# Patient Record
Sex: Female | Born: 1960 | Race: White | Hispanic: No | Marital: Married | Smoking: Never smoker
Health system: Southern US, Community
[De-identification: ages and names within clinical notes are randomized; demographics above are authoritative.]

## PROBLEM LIST (undated history)

## (undated) DIAGNOSIS — K5909 Other constipation: Secondary | ICD-10-CM

## (undated) DIAGNOSIS — G709 Myoneural disorder, unspecified: Secondary | ICD-10-CM

## (undated) DIAGNOSIS — M722 Plantar fascial fibromatosis: Secondary | ICD-10-CM

## (undated) DIAGNOSIS — G43909 Migraine, unspecified, not intractable, without status migrainosus: Secondary | ICD-10-CM

## (undated) HISTORY — DX: Migraine, unspecified, not intractable, without status migrainosus: G43.909

## (undated) HISTORY — DX: Myoneural disorder, unspecified: G70.9

## (undated) HISTORY — DX: Plantar fascial fibromatosis: M72.2

## (undated) HISTORY — DX: Other constipation: K59.09

---

## 1989-01-20 HISTORY — PX: BUNIONECTOMY: SHX129

## 1997-09-26 ENCOUNTER — Other Ambulatory Visit: Admission: RE | Admit: 1997-09-26 | Discharge: 1997-09-26 | Payer: Self-pay | Admitting: Obstetrics and Gynecology

## 1998-12-27 ENCOUNTER — Ambulatory Visit: Admission: RE | Admit: 1998-12-27 | Discharge: 1998-12-27 | Payer: Self-pay | Admitting: Internal Medicine

## 1999-11-04 ENCOUNTER — Ambulatory Visit (HOSPITAL_COMMUNITY): Admission: RE | Admit: 1999-11-04 | Discharge: 1999-11-04 | Payer: Self-pay | Admitting: *Deleted

## 2003-02-17 ENCOUNTER — Other Ambulatory Visit: Admission: RE | Admit: 2003-02-17 | Discharge: 2003-02-17 | Payer: Self-pay | Admitting: Family Medicine

## 2004-06-04 ENCOUNTER — Other Ambulatory Visit: Admission: RE | Admit: 2004-06-04 | Discharge: 2004-06-04 | Payer: Self-pay | Admitting: Family Medicine

## 2005-01-20 HISTORY — PX: SPINAL FUSION: SHX223

## 2005-04-08 ENCOUNTER — Inpatient Hospital Stay (HOSPITAL_COMMUNITY): Admission: RE | Admit: 2005-04-08 | Discharge: 2005-04-11 | Payer: Self-pay | Admitting: Neurosurgery

## 2005-12-11 ENCOUNTER — Emergency Department: Payer: Self-pay | Admitting: Emergency Medicine

## 2006-03-31 ENCOUNTER — Other Ambulatory Visit: Admission: RE | Admit: 2006-03-31 | Discharge: 2006-03-31 | Payer: Self-pay | Admitting: Family Medicine

## 2006-11-05 ENCOUNTER — Emergency Department: Payer: Self-pay | Admitting: Unknown Physician Specialty

## 2007-09-23 ENCOUNTER — Encounter: Admission: RE | Admit: 2007-09-23 | Discharge: 2007-09-23 | Payer: Self-pay | Admitting: Neurosurgery

## 2008-01-21 HISTORY — PX: SPINE SURGERY: SHX786

## 2008-04-28 ENCOUNTER — Other Ambulatory Visit: Admission: RE | Admit: 2008-04-28 | Discharge: 2008-04-28 | Payer: Self-pay | Admitting: Family Medicine

## 2008-04-30 ENCOUNTER — Emergency Department: Payer: Self-pay | Admitting: Emergency Medicine

## 2008-11-13 ENCOUNTER — Ambulatory Visit (HOSPITAL_BASED_OUTPATIENT_CLINIC_OR_DEPARTMENT_OTHER)
Admission: RE | Admit: 2008-11-13 | Discharge: 2008-11-13 | Payer: Self-pay | Admitting: Physical Medicine and Rehabilitation

## 2008-11-30 ENCOUNTER — Ambulatory Visit (HOSPITAL_COMMUNITY): Admission: RE | Admit: 2008-11-30 | Discharge: 2008-12-01 | Payer: Self-pay | Admitting: Orthopedic Surgery

## 2009-09-04 ENCOUNTER — Other Ambulatory Visit: Admission: RE | Admit: 2009-09-04 | Discharge: 2009-09-04 | Payer: Self-pay | Admitting: Family Medicine

## 2010-04-24 LAB — CBC
HCT: 40.8 % (ref 36.0–46.0)
Hemoglobin: 14.1 g/dL (ref 12.0–15.0)

## 2010-06-07 NOTE — Discharge Summary (Signed)
[name], [name]                ACCOUNT NO.:  [id number]   MEDICAL RECORD NO.:  [id number]          PATIENT TYPE:  INP   LOCATION:  3037                         FACILITY:  [facility]   PHYSICIAN:  Danae Orleans. Venetia Maxon, M.D.  DATE OF BIRTH:  1960/08/15   DATE OF ADMISSION:  04/08/2005  DATE OF DISCHARGE:  04/11/2005                                 DISCHARGE SUMMARY   REASONS FOR ADMISSION:  1.  Lumbosacral spondylolisthesis.  2.  Lumbar disk degeneration.  3.  Hypertension.  4.  Unspecified constipation.  5.  Lumbar radiculopathy, lumbar spondylosis.   FINAL DIAGNOSES:  1.  Lumbosacral spondylolisthesis.  2.  Lumbar disk degeneration.  3.  Hypertension.  4.  Unspecified constipation.  5.  Lumbar radiculopathy, lumbar spondylosis.   HISTORY OF ILLNESS/HOSPITAL COURSE:  Jessica [name] is a 50 year old woman  with grade II spondylolisthesis of L5-S1 with retrolisthesis of L4-5 with  spondylosis of L5.  She has excruciating leg and low back pain.  She was  admitted to the hospital on same-day procedure basis and underwent  uncomplicated L5 Gill procedure with L5-S1 posterior lumbar interbody fusion  with pedicle screw fixation, L4-S1 bilaterally, along with posterolateral  arthrodesis, L4-S1 levels.  She did well with surgery and was gradually  mobilized, had some vomiting initially after surgery, was felt to be  secondary to intolerance to morphine.  She was doing well on the 23rd and  was discharged home at that point with instructions to follow up in three  weeks with discharge medications of Percocet and Valium.   DISCHARGE STATUS:  Improved.      Danae Orleans. Venetia Maxon, M.D.  Electronically Signed     JDS/MEDQ  D:  06/04/2005  T:  06/04/2005  Job:  [id number]

## 2010-06-07 NOTE — Op Note (Signed)
Jessica [name], [name]                ACCOUNT NO.:  [id number]   MEDICAL RECORD NO.:  [id number]          PATIENT TYPE:  INP   LOCATION:  2899                         FACILITY:  [facility]   PHYSICIAN:  Danae Orleans. Venetia Maxon, M.D.  DATE OF BIRTH:  05-Jul-1960   DATE OF PROCEDURE:  04/08/2005  DATE OF DISCHARGE:                                 OPERATIVE REPORT   PREOPERATIVE DIAGNOSIS:  Grade 2 spondylolisthesis of L5 on S1 with  retrolisthesis of L4 on L5 with spondylolysis of L5 with degenerative disk  disease and radiculopathy.   POSTOPERATIVE DIAGNOSIS:  Grade 2 spondylolisthesis of L5 on S1 with  retrolisthesis of L4 on L5 with spondylolysis of L5 with degenerative disk  disease and radiculopathy.   OPERATION PERFORMED:  1.  L5 Gill procedure.  2.  L4-5 laminectomy.  3.  L5 through S1 posterior lumbar interbody fusion with 8 mm PEEK interbody      cages with morcellized bone autograft.  4.  Pedicle screw fixation L4 through S1 bilaterally.  5.  Posterolateral arthrodesis L4 through S1 levels.   SURGEON:  Danae Orleans. Venetia Maxon, M.D.   ASSISTANT:  Cristi Loron, M.D.   ANESTHESIA:  General endotracheal.   ESTIMATED BLOOD LOSS:  850 mL.   FLUIDS REPLACED:  300 mL of Cell Saver blood return to the patient.   COMPLICATIONS:  None.   DISPOSITION:  Recovery.   INDICATIONS FOR PROCEDURE:  Jessica [name] is a 50 year old woman with severe  left greater than right lower extremity pain with grade 2 spondylolisthesis  of L5 on S1 with retrolisthesis of L4 on L5.  She has severe foraminal nerve  root compression, left greater than right of the L5 nerve roots.  It was  elected to take her to surgery for decompression and fusion at these  affected levels.   DESCRIPTION OF PROCEDURE:  Jessica [name] was brought to the operating room.  Following the satisfactory and uncomplicated induction of general  endotracheal anesthesia and placement of intravenous lines and Foley  catheter the patient was  turned in a prone position on the operating table.  The low back was prepped and draped in the usual sterile fashion.  The area  of planned incision was infiltrated with 0.25% Marcaine, 0.5% lidocaine,  1:200,000 epinephrine.  Incision was made in the midline and carried through  adipose tissue.  The lumbodorsal fascia was incised bilaterally.  Subperiosteal dissection was then performed exposing the L4 and L5  transverse processes and sacral ala. The posterior elements of L5 were quite  loose and wedged under the L5 spinous process.  After localizing x-ray was  obtained with marker probes at the L4, L5 transverse processes and the  sacral ala, a Gill procedure of L5 was performed with resultant  decompression of the thecal sac.  Both L5 nerve roots were decompressed as  they extended out the neural foramina and there was a significant  spondylolysis, grumous and also superior articular facet of L5 which was  causing significant compression of the nerve root.  Laminectomy was done of  L4 bilaterally to decompress the  thecal sac and lateral recesses.  Subsequently the interspace at L5-S1 was decompressed.  There was  significant overgrowth of bone and considerable spondylolisthesis.  Initially the interspace was incised and then Kerrison rongeur was used to  remove the superior lip of S1 and subsequently the disk space was entered.  Disk material was removed.  An 8 mm interbody spacer was then placed  initially on the right and subsequently on the left and the disk was further  removed with a variety of curettes and ring curettes.  After a thorough  diskectomy had been performed, this was done under loupe magnification as  was the nerve root decompression.  An 8 mm PEEK interbody cage was packed  with morcellized bone autograft from the Gill procedure. This was then  inserted into the interspace, countersunk appropriately and this was done  bilaterally with significant restoration of the  foraminal height leading to  significant decompression of the L5 nerve root.  Subsequently pedicle screws  were placed, at S1 a 6.5 x 45 mm sacral screw was placed.  At L5 6.5 x 55 mm  screws were placed.  At L4 6.5 x 55 mm screws were also placed.  All screws  had excellent purchase and their position was confirmed in AP and lateral  fluoroscopy.  60 mm prelordosed rods were then affixed to the screw heads  after decortication of the transverse processes of L4, L5 and the sacral ala  were performed bilaterally and morcellized bone autograft along with bone  graft allograft extender which had been reconstituted with the marrow rich  blood aspirated from the tract of pedicle screws was then packed in the  posterolateral region.  The locking caps were then engaged and a final  tightener was used.  Prior to placing the bone graft the wound was irrigated  with bacitracin saline.  Subsequently, the self-retaining retractor was  removed.  The lumbodorsal fascia was closed with 0 Vicryl suture.  The  subcutaneous tissue were reapproximated with 2-0 Vicryl interrupted inverted  sutures and the skin edges were reapproximated with interrupted 3-0 Vicryl  subcuticular stitch.  The wound was dressed with benzoin and Steri-Strips,  Telfa gauze and tape.  The patient was extubated in the operating room and  taken to the recovery room in stable and satisfactory condition having  tolerated the operation well.  Counts were correct at the end of the case.      Danae Orleans. Venetia Maxon, M.D.  Electronically Signed     JDS/MEDQ  D:  04/08/2005  T:  04/09/2005  Job:  [id number]

## 2010-06-10 IMAGING — RF DG THORACIC SPINE 2V
1 series · 2 of 2 positions shown · non-contrast
Comparison: 11/13/2008.

CLINICAL DATA: 44-year-old male undergoing spinal stimulator
placement.  Pain.

THORACIC SPINE - 2 VIEW

[Series 1: run · 2 of 2 slices shown]
[im 1/2]
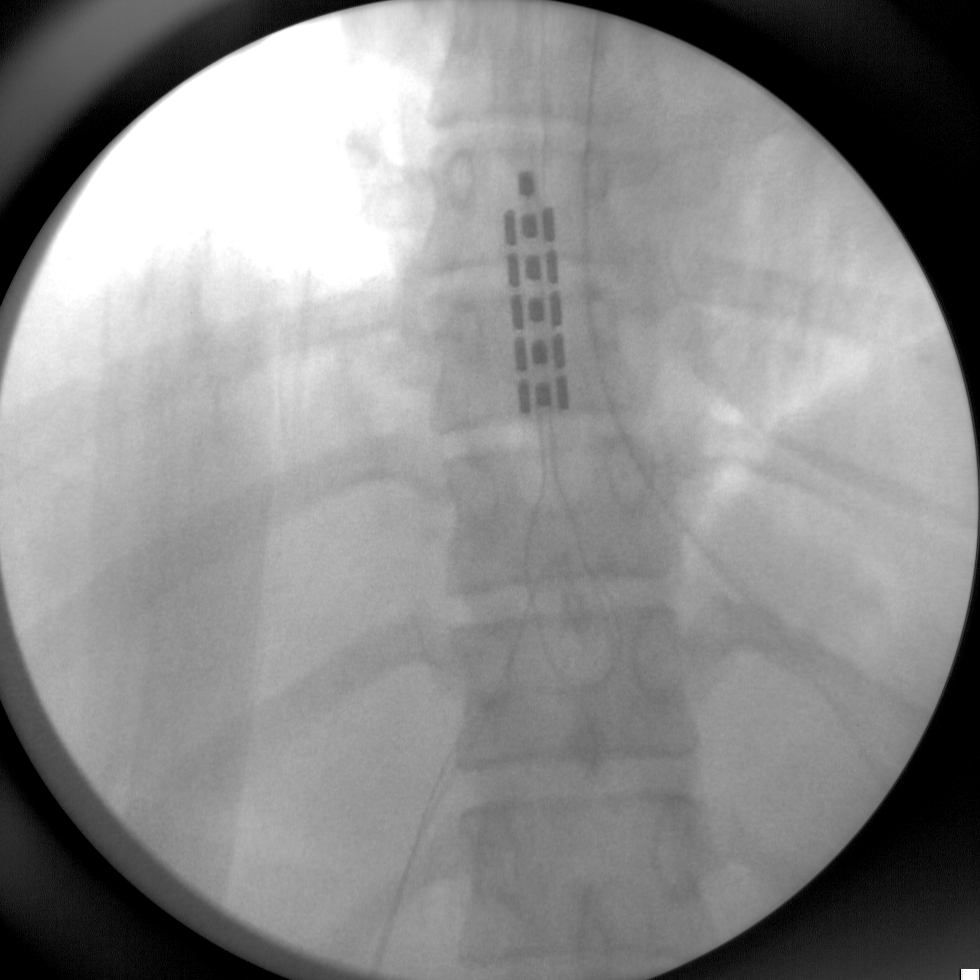
[im 2/2]
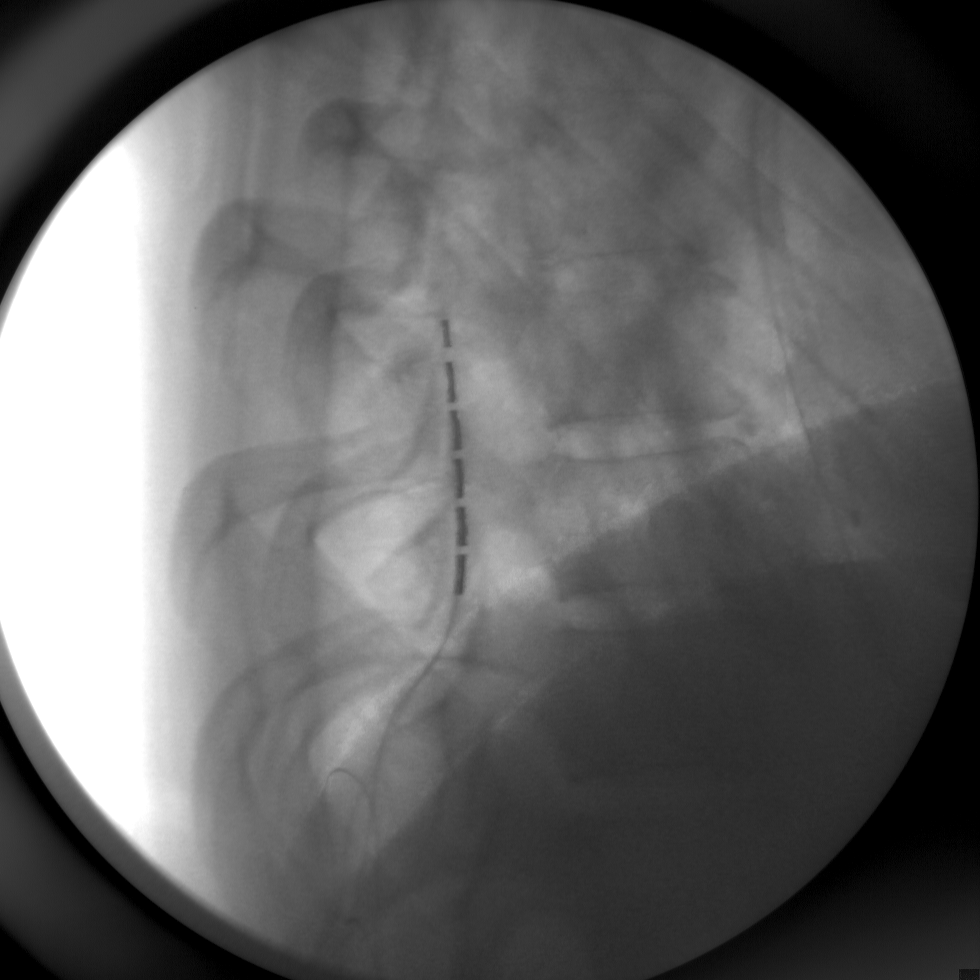

[2 of 2 positions shown; findings below may reference images not displayed]

FINDINGS: Two intraoperative fluoroscopic spot views of the
thoracic spine demonstrate spinal stimulator placement.  Assuming
the lowest visualized ribs are T12, the placement would be at T9
and T8.
IMPRESSION: Thoracic spinal stimulator device placement.

## 2011-05-06 ENCOUNTER — Ambulatory Visit: Payer: 59 | Admitting: Physical Medicine & Rehabilitation

## 2011-05-20 ENCOUNTER — Emergency Department: Payer: Self-pay | Admitting: *Deleted

## 2011-05-29 ENCOUNTER — Encounter: Payer: Self-pay | Admitting: Physical Medicine & Rehabilitation

## 2011-05-29 ENCOUNTER — Ambulatory Visit (HOSPITAL_BASED_OUTPATIENT_CLINIC_OR_DEPARTMENT_OTHER): Payer: 59 | Admitting: Physical Medicine & Rehabilitation

## 2011-05-29 ENCOUNTER — Encounter: Payer: 59 | Attending: Physical Medicine & Rehabilitation

## 2011-05-29 VITALS — BP 142/76 | HR 108 | Resp 18 | Ht 67.0 in | Wt 257.0 lb

## 2011-05-29 DIAGNOSIS — M76899 Other specified enthesopathies of unspecified lower limb, excluding foot: Secondary | ICD-10-CM

## 2011-05-29 DIAGNOSIS — M7060 Trochanteric bursitis, unspecified hip: Secondary | ICD-10-CM | POA: Insufficient documentation

## 2011-05-29 DIAGNOSIS — M797 Fibromyalgia: Secondary | ICD-10-CM

## 2011-05-29 DIAGNOSIS — M545 Low back pain, unspecified: Secondary | ICD-10-CM

## 2011-05-29 DIAGNOSIS — M25569 Pain in unspecified knee: Secondary | ICD-10-CM

## 2011-05-29 DIAGNOSIS — M961 Postlaminectomy syndrome, not elsewhere classified: Secondary | ICD-10-CM

## 2011-05-29 DIAGNOSIS — G894 Chronic pain syndrome: Secondary | ICD-10-CM

## 2011-05-29 DIAGNOSIS — M722 Plantar fascial fibromatosis: Secondary | ICD-10-CM | POA: Insufficient documentation

## 2011-05-29 DIAGNOSIS — M25562 Pain in left knee: Secondary | ICD-10-CM

## 2011-05-29 DIAGNOSIS — G8929 Other chronic pain: Secondary | ICD-10-CM | POA: Insufficient documentation

## 2011-05-29 DIAGNOSIS — IMO0001 Reserved for inherently not codable concepts without codable children: Secondary | ICD-10-CM

## 2011-05-29 NOTE — Patient Instructions (Signed)

## 2011-05-29 NOTE — Progress Notes (Signed)
Subjective:    Patient ID: Jessica [name], female    DOB: 30-Mar-1960, 51 y.o.   MRN: [id number]  HPI  Pain is widespread and berries depending on time of day. In the mornings the pain on the bottom of the feet. At night hip pain and knee pain. History of spondylolisthesis which was diagnosed 2007. Past surgical Surgery done by Dr. Venetia Maxon in 2007. Pre-operative MRI of the lumbar spine demonstrated L5-S1 grade 1 spondylolisthesis with moderate left foraminal narrowing. L4-L5-S1 fused, spinal cord stimulation permanent implant 2010 Dr. Shon Baton. The spinal cord stimulator has been helpful for left leg pain.  Medication management has been with Dr. Ethelene Hal now tapering Percocet 10 mg is down to 3 times per day with plans to go down to 2 times a day this week.   will walk between 20 and 45 minutes 4 or 5 times per day and walks on flat surfaces. Also has a history of migraine headaches and uses topiramate 200 mg at night, Zomig 5 mg intranasal as needed and Lyrica 150 mg at night Past medical history is significant for chronic constipation. Also for obesity with a BMI between 30 and 40., Depression Pain Inventory Average Pain 7 Pain Right Now 5 My pain is constant, sharp, burning, tingling and aching  In the last 24 hours, has pain interfered with the following? General activity 7 Relation with others 8 Enjoyment of life 7 What TIME of day is your pain at its worst? morning evening and night Sleep (in general) Fair  Pain is worse with: walking, bending, sitting and standing Pain improves with: rest, heat/ice, pacing activities, medication and stretching Relief from Meds: 6  Mobility walk without assistance how many minutes can you walk? 60 ability to climb steps?  yes do you drive?  yes Do you have any goals in this area?  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  Prior Studies Any changes since last visit?  no  Physicians involved in your care Any changes since last visit?  yes Primary care [place] [facility] Richard Ethelene Hal     Review of Systems  Constitutional: Positive for appetite change and unexpected weight change.  Gastrointestinal: Positive for nausea, vomiting, abdominal pain, diarrhea and constipation.  Genitourinary: Positive for difficulty urinating.  Hematological: Bruises/bleeds easily.  All other systems reviewed and are negative.       Objective:   Physical Exam  Constitutional: She is oriented to person, place, and time. She appears well-developed.  Musculoskeletal:       Cervical back: She exhibits tenderness.       Back:       Right foot: She exhibits tenderness.       Left foot: She exhibits tenderness.       Tender on the plantar aspect of the heel as well as tender over the metatarsal heads. Pain with toe extension   11/18 fibromyalgia tender points positive. Right greater trochanteric bursa very tender Lumbar range of motion is reduced Straight leg raising test is negative Right knee and hip range of motion is normal     Neurological: She is alert and oriented to person, place, and time. She has normal strength. A sensory deficit is present.  Reflex Scores:  Tricep reflexes are 2+ on the right side and 2+ on the left side.      Bicep reflexes are 2+ on the right side and 2+ on the left side.      Brachioradialis reflexes are 2+ on the right side and 2+ on the left side.      Patellar reflexes are 2+ on the right side and 2+ on the left side.      Achilles reflexes are 2+ on the right side and 2+ on the left side.      Decreased left L3, L5, S1   Pain over the plantar fascia bilaterally.        Assessment & Plan:  1. Lumbar postlaminectomy syndrome with chronic pain. She has neurogenic pain which has responded partially to spinal cord stimulation. She has chronic axial back pain which is only partially relieved with narcotic analgesics however she is having some side effects including severe dryness of mouth and she would like to wean off of these medicines. She will be down to 2 10 mg tablets of oxycodone next week I have instructed her to instead break them in half and take one half tablet 4 times per day.   2. The right hip trochanteric bursitis, injection today, instruct range of motion   right hip trochanteric bursa injection indication is right hip pain severe which is interfering with sleep and only partially response to medication management. Patient placed in a left lateral decubitus position after informed consent was obtained. Area was marked and prepped with Betadine and entered with a 25-gauge 3 inch spinal needle inserted to bone contact. Then a solution containing 1 cc of 40 mg per cc Depo-Medrol and 4 cc of 1% lidocaine were injected. Patient tolerated procedure well 3. Fibromyalgia syndrome this is a probable diagnosis. Reportedly had normal TSH last month. Will check arthritis panel. Patient information given 4. Plantar fasciitis instruct stretching exercises 5. Left knee pain no history of recent trauma. Will check x-rays and possibly inject next visit in 2-3 weeks.

## 2011-05-30 LAB — RHEUMATOID FACTOR: Rhuematoid fact SerPl-aCnc: <10 IU/mL (ref <=14)

## 2011-06-06 ENCOUNTER — Telehealth: Payer: Self-pay | Admitting: *Deleted

## 2011-06-06 NOTE — Telephone Encounter (Signed)
Pt aware of normal labs

## 2011-06-06 NOTE — Telephone Encounter (Signed)
Message copied by Caryl Ada on Fri Jun 06, 2011  8:39 AM ------      Message from: Ricarda Frame E      Created: Thu Jun 05, 2011  4:03 PM       Blood work normal;no evidence of inflammatory arthritis

## 2011-06-09 ENCOUNTER — Emergency Department: Payer: Self-pay | Admitting: Emergency Medicine

## 2011-06-10 ENCOUNTER — Ambulatory Visit
Admission: RE | Admit: 2011-06-10 | Discharge: 2011-06-10 | Disposition: A | Discharge status: Home / Self Care | Payer: 59 | Source: Ambulatory Visit | Attending: Physical Medicine & Rehabilitation | Admitting: Physical Medicine & Rehabilitation

## 2011-06-10 DIAGNOSIS — M25562 Pain in left knee: Secondary | ICD-10-CM

## 2011-06-11 ENCOUNTER — Telehealth: Payer: Self-pay | Admitting: *Deleted

## 2011-06-11 NOTE — Telephone Encounter (Signed)
Notified Jessica [name] on personal identified voicemail that her knee x-ray was normal.

## 2011-06-17 ENCOUNTER — Encounter: Payer: Self-pay | Admitting: Physical Medicine & Rehabilitation

## 2011-06-17 ENCOUNTER — Ambulatory Visit (HOSPITAL_BASED_OUTPATIENT_CLINIC_OR_DEPARTMENT_OTHER): Payer: 59 | Admitting: Physical Medicine & Rehabilitation

## 2011-06-17 VITALS — BP 125/70 | HR 78 | Resp 16 | Ht 67.0 in | Wt 252.0 lb

## 2011-06-17 DIAGNOSIS — M25569 Pain in unspecified knee: Secondary | ICD-10-CM

## 2011-06-17 DIAGNOSIS — IMO0001 Reserved for inherently not codable concepts without codable children: Secondary | ICD-10-CM

## 2011-06-17 DIAGNOSIS — M1710 Unilateral primary osteoarthritis, unspecified knee: Secondary | ICD-10-CM

## 2011-06-17 DIAGNOSIS — M797 Fibromyalgia: Secondary | ICD-10-CM

## 2011-06-17 MED ORDER — PREGABALIN 150 MG PO CAPS: 150.0000 mg | ORAL_CAPSULE | Freq: Two times a day (BID) | ORAL | Status: DC

## 2011-06-17 MED ORDER — CYCLOBENZAPRINE HCL 5 MG PO TABS: 5.0000 mg | ORAL_TABLET | Freq: Two times a day (BID) | ORAL | Status: DC | PRN

## 2011-06-17 NOTE — Progress Notes (Signed)
Left knee injection Indication osteoarthritis and left knee pain unresponsive to oral medication management Informed consent was obtained after describing risks and benefits of the procedure with the patient his include bleeding bruising and infection she elects to proceed and has given written consent patient placed in a supine position towel roll underneath the left knee superior lateral approach used. Area marked and prepped with Betadine and alcohol entered with a 25-gauge inch Needle after negative draw back for blood 1 cc of 40 mg per cc Depo-Medrol and 4 cc 1% lidocaine were injected. Patient tolerated procedure well post procedure instructions given

## 2011-06-17 NOTE — Progress Notes (Signed)
Subjective:    Patient ID: Jessica [name], female    DOB: December 09, 1960, 51 y.o.   MRN: [id number]  HPI  Right hip injection helps her sleep on her right side. Continues have left knee pain complains of frequent urination she will be seeing her primary physician today Patient is taking one half tablet oxycodone 4 times per day Pain Inventory Average Pain 8 Pain Right Now 5 My pain is constant, sharp, burning, tingling and aching  In the last 24 hours, has pain interfered with the following? General activity 7 Relation with others 6 Enjoyment of life 6 What TIME of day is your pain at its worst? Morning and Night Sleep (in general) Fair  Pain is worse with: walking, bending, sitting and standing Pain improves with: rest, heat/ice, medication and injections Relief from Meds: 7  Mobility walk without assistance how many minutes can you walk? 40-60 ability to climb steps?  yes do you drive?  yes  Function employed # of hrs/week 20-25 I need assistance with the following:  dressing and household duties  Neuro/Psych bladder control problems weakness numbness tingling confusion depression suicidal thoughts  Prior Studies Any changes since last visit?  no  Physicians involved in your care Any changes since last visit?  no   Family History  Problem Relation Age of Onset  . Depression Mother   . Celiac disease Mother   . Hypertension Father    History   Social History  . Marital Status: Married    Spouse Name: N/A    Number of Children: N/A  . Years of Education: N/A   Social History Main Topics  . Smoking status: Never Smoker   . Smokeless tobacco: Never Used  . Alcohol Use: None  . Drug Use: None  . Sexually Active: None   Other Topics Concern  . None   Social History Narrative  . None   Past Surgical History  Procedure Date  . Spine surgery 2010    nerve stimulator implant  . Spinal fusion 2007  . Bunionectomy 1991    bilateral   Past Medical  History  Diagnosis Date  . Neuromuscular disorder   . Chronic constipation   . Migraines    BP 125/70  Pulse 78  Resp 16  Ht 5\' 7"  (1.702 m)  Wt 252 lb (114.306 kg)  BMI 39.47 kg/m2  SpO2 98%  LMP 06/10/2011      Review of Systems  Constitutional: Positive for appetite change and unexpected weight change.  Gastrointestinal: Positive for nausea, vomiting and constipation.  Hematological: Bruises/bleeds easily.  Psychiatric/Behavioral: Positive for suicidal ideas (No Plan at this time).       Objective:   Physical Exam  Left knee without evidence of effusion. Ambulation without evidence of toe drag or knee instability Mood and affect is appropriate      Assessment & Plan:   1. Lumbar postlaminectomy syndrome with chronic pain. She has neurogenic pain which has responded partially to spinal cord stimulation. She has chronic axial back pain which is only partially relieved with narcotic analgesics however she is having some side effects including severe dryness of mouth and she would like to wean off of these medicines. She is down to 2 10 mg tablets of oxycodone this week I have instructed her to instead break them in half and take one half tablet 3 times per day. 2. The right hip trochanteric bursitis,improved 3. Fibromyalgia syndrome this is a probable diagnosis. Reportedly had normal TSH last month.  Will check arthritis panel. Patient information given increase Lyrica at 150 mg twice a day 4. Plantar fasciitis instruct stretching exercises  5. Left knee pain no history of recent trauma. Will  inject today next visit in 3 weeks.

## 2011-06-17 NOTE — Patient Instructions (Signed)
Reduce oxycodone to one half tablet 3 times per day until your next visit Increase Lyrica to 1 tablet twice a day You may benefit from a urology consultation to evaluate for interstitial cystitis Will see me back in 3-4 weeks Continue her exercises for your plantar fasciitis

## 2011-07-07 ENCOUNTER — Ambulatory Visit (HOSPITAL_BASED_OUTPATIENT_CLINIC_OR_DEPARTMENT_OTHER): Payer: 59 | Admitting: Physical Medicine & Rehabilitation

## 2011-07-07 ENCOUNTER — Encounter: Payer: 59 | Attending: Physical Medicine & Rehabilitation

## 2011-07-07 ENCOUNTER — Encounter: Payer: Self-pay | Admitting: Physical Medicine & Rehabilitation

## 2011-07-07 VITALS — BP 136/76 | HR 81 | Resp 14 | Ht 67.0 in | Wt 252.0 lb

## 2011-07-07 DIAGNOSIS — G8929 Other chronic pain: Secondary | ICD-10-CM | POA: Insufficient documentation

## 2011-07-07 DIAGNOSIS — M545 Low back pain, unspecified: Secondary | ICD-10-CM | POA: Insufficient documentation

## 2011-07-07 DIAGNOSIS — G5712 Meralgia paresthetica, left lower limb: Secondary | ICD-10-CM

## 2011-07-07 DIAGNOSIS — M797 Fibromyalgia: Secondary | ICD-10-CM

## 2011-07-07 DIAGNOSIS — G5710 Meralgia paresthetica, unspecified lower limb: Secondary | ICD-10-CM

## 2011-07-07 DIAGNOSIS — M25569 Pain in unspecified knee: Secondary | ICD-10-CM | POA: Insufficient documentation

## 2011-07-07 DIAGNOSIS — M961 Postlaminectomy syndrome, not elsewhere classified: Secondary | ICD-10-CM

## 2011-07-07 DIAGNOSIS — M722 Plantar fascial fibromatosis: Secondary | ICD-10-CM | POA: Insufficient documentation

## 2011-07-07 DIAGNOSIS — IMO0001 Reserved for inherently not codable concepts without codable children: Secondary | ICD-10-CM

## 2011-07-07 DIAGNOSIS — M76899 Other specified enthesopathies of unspecified lower limb, excluding foot: Secondary | ICD-10-CM | POA: Insufficient documentation

## 2011-07-07 NOTE — Progress Notes (Signed)
Subjective:    Patient ID: Jessica [name], female    DOB: 05-11-1960, 51 y.o.   MRN: [id number]  HPI Knee injection was helpful on the left side Numbness in the left lateral thigh. No falls or any trauma. Achiness around the neck area was improved with increased  Lyrica dosage Pain Inventory Average Pain 7 Pain Right Now 6 My pain is constant, sharp, burning, dull, tingling and aching  In the last 24 hours, has pain interfered with the following? General activity 8 Relation with others 7 Enjoyment of life 7 What TIME of day is your pain at its worst? morning and evening Sleep (in general) Fair  Pain is worse with: walking, sitting and standing Pain improves with: rest, heat/ice, medication, TENS and injections Relief from Meds: 7  Mobility how many minutes can you walk? 20-35 ability to climb steps?  yes do you drive?  yes Do you have any goals in this area?  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  yes  Neuro/Psych bladder control problems weakness numbness tingling spasms confusion depression  Prior Studies Any changes since last visit?  no  Physicians involved in your care Any changes since last visit?  no   Family History  Problem Relation Age of Onset  . Depression Mother   . Celiac disease Mother   . Hypertension Father    History   Social History  . Marital Status: Married    Spouse Name: N/A    Number of Children: N/A  . Years of Education: N/A   Social History Main Topics  . Smoking status: Never Smoker   . Smokeless tobacco: Never Used  . Alcohol Use: None  . Drug Use: None  . Sexually Active: None   Other Topics Concern  . None   Social History Narrative  . None   Past Surgical History  Procedure Date  . Spine surgery 2010    nerve stimulator implant  . Spinal fusion 2007  . Bunionectomy 1991    bilateral   Past Medical History  Diagnosis Date  . Neuromuscular disorder   .  Chronic constipation   . Migraines    BP 136/76  Pulse 81  Resp 14  Ht 5\' 7"  (1.702 m)  Wt 252 lb (114.306 kg)  BMI 39.47 kg/m2  SpO2 99%  LMP 06/10/2011     Review of Systems  Constitutional: Positive for unexpected weight change.  Gastrointestinal: Positive for nausea, diarrhea and constipation.  Musculoskeletal: Positive for joint swelling.  Neurological: Positive for weakness and numbness.  All other systems reviewed and are negative.       Objective:   Physical Exam  Constitutional: She is oriented to person, place, and time.  Musculoskeletal:       Right hip: She exhibits tenderness.       Left hip: She exhibits tenderness.       Cervical back: She exhibits tenderness.       Lumbar back: She exhibits decreased range of motion and tenderness.       Tenderness in 8 fibromyalgia tender points Reduced range of motion in the low back area Straight leg raising test is negative   Neurological: She is alert and oriented to person, place, and time. She has normal strength. She displays no atrophy and no tremor. A sensory deficit is present. She exhibits normal muscle tone. Coordination normal.       Decreased sensation to light touch  in the left lateral thigh          Assessment & Plan:  1. Lumbar postlaminectomy syndrome with chronic pain. She has neurogenic pain which has responded partially to spinal cord stimulation. She has chronic axial back pain which is only partially relieved with narcotic analgesics however she is having some side effects including severe dryness of mouth and she would like to wean off of these medicines. She is down to1/2 pwercocet 3 times a day.  Will reduce to twice a day.2. The right hip trochanteric bursitis,improved  3. Fibromyalgia syndrome this is a probable diagnosis.  increase Lyrica at 150 mgTID 4. Plantar fasciitis doing better with heel cord stretch 5.  Meralgia paresthetica-recommend looser clothing. She really does not have pain  but numbness. Weight loss advised.

## 2011-07-07 NOTE — Patient Instructions (Addendum)
Meralgia Paresthetica  Meralgia paresthetica (MP) is a disorder characterized by tingling, numbness, and burning pain in the outer side of the thigh. It occurs in men more than women. MP is generally found in middle-aged or overweight people. Sometimes, the disorder may disappear. CAUSES The disorder is caused by a nerve in the thigh being squeezed (compressed). MP may be associated with tight clothing, pregnancy, diabetes, and being overweight (obese). SYMPTOMS  Tingling, numbness, and burning in the outer thigh.   An area of the skin may be painful and sensitive to the touch.  The symptoms often worsen after walking or standing. TREATMENT  Treatment is based on your symptoms and is mainly supportive. Treatment may include:  Wearing looser clothing.   Losing weight.   Avoiding prolonged standing or walking.   Taking medication.   Surgery if the pain is peristent or severe.  MP usually eases or disappears after treatment. Surgery is not always fully successful. Document Released: 12/27/2001 Document Revised: 12/26/2010 Document Reviewed: 01/06/2005 [employer] Patient Information 2012 [employer]. 

## 2011-07-18 ENCOUNTER — Telehealth: Payer: Self-pay | Admitting: *Deleted

## 2011-07-18 MED ORDER — PREGABALIN 150 MG PO CAPS: 150.0000 mg | ORAL_CAPSULE | Freq: Three times a day (TID) | ORAL | Status: DC

## 2011-07-18 NOTE — Telephone Encounter (Signed)
450mg  of Lyrica is working well, will need a refill.

## 2011-07-18 NOTE — Telephone Encounter (Signed)
Rx has been called in, pt aware. 

## 2011-08-11 ENCOUNTER — Encounter: Payer: Self-pay | Admitting: Physical Medicine & Rehabilitation

## 2011-08-11 ENCOUNTER — Ambulatory Visit (HOSPITAL_BASED_OUTPATIENT_CLINIC_OR_DEPARTMENT_OTHER): Payer: 59 | Admitting: Physical Medicine & Rehabilitation

## 2011-08-11 ENCOUNTER — Encounter: Payer: 59 | Attending: Physical Medicine & Rehabilitation

## 2011-08-11 VITALS — BP 122/70 | HR 85 | Resp 16 | Ht 67.0 in | Wt 250.0 lb

## 2011-08-11 DIAGNOSIS — G5710 Meralgia paresthetica, unspecified lower limb: Secondary | ICD-10-CM

## 2011-08-11 DIAGNOSIS — M545 Low back pain, unspecified: Secondary | ICD-10-CM | POA: Insufficient documentation

## 2011-08-11 DIAGNOSIS — M25569 Pain in unspecified knee: Secondary | ICD-10-CM | POA: Insufficient documentation

## 2011-08-11 DIAGNOSIS — M722 Plantar fascial fibromatosis: Secondary | ICD-10-CM | POA: Insufficient documentation

## 2011-08-11 DIAGNOSIS — G5712 Meralgia paresthetica, left lower limb: Secondary | ICD-10-CM

## 2011-08-11 DIAGNOSIS — M961 Postlaminectomy syndrome, not elsewhere classified: Secondary | ICD-10-CM

## 2011-08-11 DIAGNOSIS — G8929 Other chronic pain: Secondary | ICD-10-CM | POA: Insufficient documentation

## 2011-08-11 DIAGNOSIS — M76899 Other specified enthesopathies of unspecified lower limb, excluding foot: Secondary | ICD-10-CM | POA: Insufficient documentation

## 2011-08-11 DIAGNOSIS — IMO0001 Reserved for inherently not codable concepts without codable children: Secondary | ICD-10-CM

## 2011-08-11 DIAGNOSIS — M7060 Trochanteric bursitis, unspecified hip: Secondary | ICD-10-CM

## 2011-08-11 DIAGNOSIS — M797 Fibromyalgia: Secondary | ICD-10-CM

## 2011-08-11 MED ORDER — CYCLOBENZAPRINE HCL 5 MG PO TABS: 5.0000 mg | ORAL_TABLET | Freq: Every evening | ORAL | Status: DC | PRN

## 2011-08-11 NOTE — Progress Notes (Signed)
Subjective:    Patient ID: Jessica [name], female    DOB: 1960-08-06, 51 y.o.   MRN: [id number]  HPI Right-sided neck pain, right hip pain improved, off of Percocet. Just ran out of Flexeril. Weight loss of 3 pounds since last visit. Continuing efforts Pain Inventory Average Pain 6 Pain Right Now 8 My pain is constant, burning, dull, tingling and aching  In the last 24 hours, has pain interfered with the following? General activity 9 Relation with others 9 Enjoyment of life 9 What TIME of day is your pain at its worst? morning and evening Sleep (in general) Fair  Pain is worse with: walking, sitting, inactivity and standing Pain improves with: rest, heat/ice, medication, TENS and stimulator implant Relief from Meds: 7  Mobility walk without assistance how many minutes can you walk? 20-25 ability to climb steps?  yes do you drive?  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  no  Physicians involved in your care Any changes since last visit?  no   Family History  Problem Relation Age of Onset  . Depression Mother   . Celiac disease Mother   . Hypertension Father    History   Social History  . Marital Status: Married    Spouse Name: N/A    Number of Children: N/A  . Years of Education: N/A   Social History Main Topics  . Smoking status: Never Smoker   . Smokeless tobacco: Never Used  . Alcohol Use: None  . Drug Use: None  . Sexually Active: None   Other Topics Concern  . None   Social History Narrative  . None   Past Surgical History  Procedure Date  . Spine surgery 2010    nerve stimulator implant  . Spinal fusion 2007  . Bunionectomy 1991    bilateral   Past Medical History  Diagnosis Date  . Neuromuscular disorder   . Chronic  constipation   . Migraines    BP 122/70  Pulse 85  Resp 16  Ht 5\' 7"  (1.702 m)  Wt 250 lb (113.399 kg)  BMI 39.16 kg/m2  SpO2 96%     Review of Systems  Constitutional: Positive for unexpected weight change.  Gastrointestinal: Positive for nausea, vomiting, diarrhea and constipation.  Musculoskeletal: Positive for back pain.       Neck pain  Hematological: Bruises/bleeds easily.       Objective:   Physical Exam  Constitutional: She is oriented to person, place, and time. She appears well-developed.       obese  HENT:  Head: Normocephalic and atraumatic.  Right Ear: External ear normal.  Left Ear: External ear normal.  Eyes: Conjunctivae and EOM are normal. Pupils are equal, round, and reactive to light.  Neck: Normal range of motion.  Musculoskeletal:       Right foot: She exhibits tenderness.       Left foot: She exhibits tenderness.       Tenderness over the plantar surface of the heel bilaterally Tenderness over the right [place] and upper medial scapular border Tenderness over the right greater trochanter of the femur  Neurological: She is alert and oriented to person, place, and time. She displays no atrophy. A sensory deficit is present. She exhibits normal muscle tone. Gait normal.  Reflex Scores:  Tricep reflexes are 2+ on the right side and 2+ on the left side.      Bicep reflexes are 2+ on the right side and 2+ on the left side.      Brachioradialis reflexes are 2+ on the right side and 2+ on the left side.      Patellar reflexes are 2+ on the right side and 2+ on the left side.      Achilles reflexes are 2+ on the right side and 2+ on the left side.      Decreased sensation left lateral thigh  Psychiatric: She has a normal mood and affect.          Assessment & Plan:  1. Lumbar postlaminectomy syndrome with chronic pain. She has neurogenic pain which has responded partially to spinal cord stimulation. She has chronic axial back pain which is only  partially relieved with narcotic analgesics however she is having some side effects including severe dryness of mouth and she would like to wean off of these medicines. She is off percocet.     2. The right hip trochanteric bursitis,improved  3. Fibromyalgia syndrome this is a probable diagnosis. increase Lyrica at 150 mgTID  4. Plantar fasciitis doing better with heel cord stretch  5. Meralgia paresthetica improving-recommend looser clothing. She really does not have pain but numbness. Weight loss advised.  6. Myofascial pain right trapezius right leaving her scapula will inject today  Trigger point injection Indication myofascial pain not responsive to medication management and other conservative care. Right upper trapezius and right Levator scapulae marked and prepped with Betadine. 25-gauge 1.5 inch needle was used. 1 cc of lidocaine injected into each side after negative draw back for blood. Patient tolerated procedure well. Post procedure instructions given.

## 2011-08-11 NOTE — Patient Instructions (Addendum)
If your right hip pain increases call to schedule a right hip injection    This is the cause of your neck pain Myofascial Pain Syndrome Myofascial pain syndrome is a pain disorder. This pain may be felt in the muscles. It may come and go. Myofascial pain syndrome always has trigger or tender points in the muscle that will cause pain when pressed.  CAUSES Myofascial pain may be caused by injuries, especially auto accidents, or by overuse of certain muscles. Typically the pain is long lasting. It is made worse by overuse of the involved muscles, emotional distress, and by cold, damp weather. Myofascial pain syndrome often develops in patients whose response to stress is an increase in muscle tone, and is seen in greater frequency in patients with pre-existing tension headaches. SYMPTOMS  Myofascial pain syndrome causes a wide variety of symptoms. You may see tight ropy bands of muscle. Problems may also include aching, cramping, burning, numbness, tingling, and other uncomfortable sensations in muscular areas. It most commonly affects the neck, upper back, and shoulder areas. Pain often radiates into the arms and hands.  TREATMENT Treatment includes resting the affected muscular area and applying ice packs to reduce spasm and pain. Trigger point injection, is a valuable initial therapy. This therapy is an injection of local anesthetic directly into the trigger point. Trigger points are often present at the source of pain. Pain relief following injection confirms the diagnosis of myofascial pain syndrome. Fairly vigorous therapy can be carried out during the pain-free period after each injection. Stretching exercises to loosen up the muscles are also useful. Transcutaneous electrical nerve stimulation (TENS) may provide relief from pain. TENS is the use of electric current produced by a device to stimulate the nerves. Ultrasound therapy applied directly over the affected muscle may also provide pain relief.  Anti-inflammatory pain medicine can be helpful. Symptoms will gradually improve over a period of weeks to months with proper treatment. HOME CARE INSTRUCTIONS Call your caregiver for follow-up care as recommended.  SEEK MEDICAL CARE IF:  Your pain is severe and not helped with medications. Document Released: 02/14/2004 Document Revised: 12/26/2010 Document Reviewed: 02/22/2010 [facility] Patient Information 2012 [place], [place].

## 2011-09-17 ENCOUNTER — Other Ambulatory Visit: Payer: Self-pay | Admitting: *Deleted

## 2011-09-17 MED ORDER — PREGABALIN 150 MG PO CAPS: 150.0000 mg | ORAL_CAPSULE | Freq: Three times a day (TID) | ORAL | Status: DC

## 2011-10-07 ENCOUNTER — Ambulatory Visit: Payer: 59 | Admitting: Physical Medicine & Rehabilitation

## 2011-10-10 ENCOUNTER — Encounter: Payer: 59 | Attending: Physical Medicine & Rehabilitation

## 2011-10-10 ENCOUNTER — Encounter: Payer: Self-pay | Admitting: Physical Medicine & Rehabilitation

## 2011-10-10 ENCOUNTER — Ambulatory Visit (HOSPITAL_BASED_OUTPATIENT_CLINIC_OR_DEPARTMENT_OTHER): Payer: 59 | Admitting: Physical Medicine & Rehabilitation

## 2011-10-10 VITALS — BP 130/72 | HR 91 | Resp 16 | Ht 69.0 in | Wt 252.0 lb

## 2011-10-10 DIAGNOSIS — M545 Low back pain, unspecified: Secondary | ICD-10-CM | POA: Insufficient documentation

## 2011-10-10 DIAGNOSIS — M76899 Other specified enthesopathies of unspecified lower limb, excluding foot: Secondary | ICD-10-CM | POA: Insufficient documentation

## 2011-10-10 DIAGNOSIS — G8929 Other chronic pain: Secondary | ICD-10-CM | POA: Insufficient documentation

## 2011-10-10 DIAGNOSIS — M722 Plantar fascial fibromatosis: Secondary | ICD-10-CM | POA: Insufficient documentation

## 2011-10-10 DIAGNOSIS — M961 Postlaminectomy syndrome, not elsewhere classified: Secondary | ICD-10-CM | POA: Insufficient documentation

## 2011-10-10 DIAGNOSIS — M25569 Pain in unspecified knee: Secondary | ICD-10-CM | POA: Insufficient documentation

## 2011-10-10 DIAGNOSIS — IMO0001 Reserved for inherently not codable concepts without codable children: Secondary | ICD-10-CM

## 2011-10-10 MED ORDER — PREGABALIN 150 MG PO CAPS: 150.0000 mg | ORAL_CAPSULE | Freq: Three times a day (TID) | ORAL | Status: DC

## 2011-10-10 NOTE — Progress Notes (Signed)
Subjective:    Patient ID: Jessica [name], female    DOB: 02/02/1960, 51 y.o.   MRN: [id number]  HPI  Trigger point injection of the right trapezius and upper medial scapular border was helpful. Her right trapezius pain has resolved. Her medial scapular border pain has recurred No new trauma no new medical issues.. Increase dose of Lyrica helpful Pain Inventory Average Pain 5 Pain Right Now 6 My pain is sharp, burning, dull, tingling and aching  In the last 24 hours, has pain interfered with the following? General activity 7 Relation with others 8 Enjoyment of life 8 What TIME of day is your pain at its worst? All Day Sleep (in general) Fair  Pain is worse with: walking, sitting, standing and some activites Pain improves with: rest, heat/ice, medication, TENS and injections Relief from Meds: 7  Mobility walk without assistance how many minutes can you walk? 25-40 ability to climb steps?  yes do you drive?  yes  Function I need assistance with the following:  dressing  Neuro/Psych weakness numbness tingling dizziness depression  Prior Studies Any changes since last visit?  no  Physicians involved in your care Any changes since last visit?  no   Family History  Problem Relation Age of Onset  . Depression Mother   . Celiac disease Mother   . Hypertension Father    History   Social History  . Marital Status: Married    Spouse Name: N/A    Number of Children: N/A  . Years of Education: N/A   Social History Main Topics  . Smoking status: Never Smoker   . Smokeless tobacco: Never Used  . Alcohol Use: None  . Drug Use: None  . Sexually Active: None   Other Topics Concern  . None   Social History Narrative  . None   Past Surgical History  Procedure Date  . Spine surgery 2010    nerve stimulator implant  . Spinal fusion 2007  . Bunionectomy 1991    bilateral   Past Medical History  Diagnosis Date  . Neuromuscular disorder   . Chronic  constipation   . Migraines    There were no vitals taken for this visit.      Review of Systems  Constitutional: Positive for unexpected weight change.  HENT: Positive for neck pain.   Eyes: Negative.   Respiratory: Negative.   Cardiovascular: Negative.   Gastrointestinal: Positive for diarrhea and constipation.  Genitourinary: Negative.   Musculoskeletal: Positive for back pain.  Skin: Negative.   Neurological: Positive for dizziness, weakness and numbness.  Hematological: Negative.   Psychiatric/Behavioral: Negative.        Objective:   Physical Exam Tenderness to palpation in the right upper medial scapula border Gait is normal No tenderness in the upper trapezius muscle Motor strength is normal Mood and affect are appropriate Extremities without peripheral edema       Assessment & Plan:  1. Myofascial pain syndrome with recurrent upper medial scapular border pain. Will repeat trigger point injection 2. Fibromyalgia syndrome doing overall better with Lyrica 150 mg 3 times a day we'll continue this dose.  Trigger point injection right upper medial scapular border levator scapulae muscle Informed consent was obtained after describing risks and benefits of the procedure with the patient these include bleeding bruising and infection she elects to proceed and has given written consent Patient placed in a seated position area marked and prepped with Betadine alcohol and with a 25-gauge inch and a half  needle 1 cc 1% lidocaine injected into the levator scapula muscle. Patient tolerated procedure well. Post procedure instructions given

## 2011-10-10 NOTE — Patient Instructions (Addendum)
We did a trigger point injection of your levator scapula muscle Three-month supply of Lyrica  Return to clinic 2 months possible reinjection

## 2011-12-05 ENCOUNTER — Ambulatory Visit: Payer: 59 | Admitting: Physical Medicine & Rehabilitation

## 2011-12-11 ENCOUNTER — Ambulatory Visit (HOSPITAL_BASED_OUTPATIENT_CLINIC_OR_DEPARTMENT_OTHER): Payer: 59 | Admitting: Physical Medicine & Rehabilitation

## 2011-12-11 ENCOUNTER — Encounter: Payer: 59 | Attending: Physical Medicine & Rehabilitation

## 2011-12-11 ENCOUNTER — Other Ambulatory Visit: Payer: Self-pay | Admitting: Physical Medicine & Rehabilitation

## 2011-12-11 ENCOUNTER — Encounter: Payer: Self-pay | Admitting: Physical Medicine & Rehabilitation

## 2011-12-11 VITALS — BP 134/70 | HR 101 | Resp 14 | Ht 67.0 in | Wt 253.0 lb

## 2011-12-11 DIAGNOSIS — M722 Plantar fascial fibromatosis: Secondary | ICD-10-CM

## 2011-12-11 DIAGNOSIS — M25569 Pain in unspecified knee: Secondary | ICD-10-CM | POA: Insufficient documentation

## 2011-12-11 DIAGNOSIS — M961 Postlaminectomy syndrome, not elsewhere classified: Secondary | ICD-10-CM | POA: Insufficient documentation

## 2011-12-11 DIAGNOSIS — M545 Low back pain, unspecified: Secondary | ICD-10-CM | POA: Insufficient documentation

## 2011-12-11 DIAGNOSIS — M76899 Other specified enthesopathies of unspecified lower limb, excluding foot: Secondary | ICD-10-CM | POA: Insufficient documentation

## 2011-12-11 DIAGNOSIS — G8929 Other chronic pain: Secondary | ICD-10-CM | POA: Insufficient documentation

## 2011-12-11 NOTE — Patient Instructions (Addendum)
Recommend walking every day but lift it into 2x15 minute or 3x10 minute walks Do ice massage with frozen water bottle after walk Go to [employer] [facility]. [place] [address] from [employer] on [address] current medicines

## 2011-12-11 NOTE — Progress Notes (Signed)
Subjective:    Patient ID: Jessica [name], female    DOB: October 26, 1960, 51 y.o.   MRN: [id number]  HPI Talking in sleep, wondering whether it is med related Foot pain plantar surface  Pain Inventory Average Pain 7 Pain Right Now 5 My pain is dull, stabbing, tingling and aching  In the last 24 hours, has pain interfered with the following? General activity 6 Relation with others 8 Enjoyment of life 5 What TIME of day is your pain at its worst? varies Sleep (in general) Fair  Pain is worse with: walking and bending Pain improves with: pacing activities and medication Relief from Meds: 7  Mobility walk without assistance how many minutes can you walk? 45-60 ability to climb steps?  yes do you drive?  yes Do you have any goals in this area?  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  no  Physicians involved in your care Any changes since last visit?  no   Family History  Problem Relation Age of Onset  . Depression Mother   . Celiac disease Mother   . Hypertension Father    History   Social History  . Marital Status: Married    Spouse Name: N/A    Number of Children: N/A  . Years of Education: N/A   Social History Main Topics  . Smoking status: Never Smoker   . Smokeless tobacco: Never Used  . Alcohol Use: None  . Drug Use: None  . Sexually Active: None   Other Topics Concern  . None   Social History Narrative  . None   Past Surgical History  Procedure Date  . Spine surgery 2010    nerve stimulator implant  . Spinal fusion 2007  . Bunionectomy 1991    bilateral   Past Medical History  Diagnosis Date  . Neuromuscular disorder   . Chronic constipation   . Migraines    BP 134/70  Pulse 101  Resp 14  Ht 5\' 7"  (1.702 m)  Wt 253 lb (114.76 kg)  BMI 39.63 kg/m2  SpO2 99%     Review of Systems    Constitutional: Positive for unexpected weight change.  Gastrointestinal: Positive for constipation.  Musculoskeletal: Positive for myalgias and arthralgias.  Neurological: Positive for weakness and numbness.  Hematological: Bruises/bleeds easily.  All other systems reviewed and are negative.       Objective:   Physical Exam  Nursing note and vitals reviewed. Constitutional: She is oriented to person, place, and time. She appears well-developed.       obese  HENT:  Head: Normocephalic and atraumatic.  Eyes: Conjunctivae normal and EOM are normal. Pupils are equal, round, and reactive to light.  Musculoskeletal:       Right ankle: She exhibits normal range of motion, no swelling and no ecchymosis. no tenderness. Achilles tendon normal.       Left ankle: She exhibits normal range of motion, no swelling and no ecchymosis. no tenderness. Achilles tendon normal.       Right foot: She exhibits decreased range of motion, tenderness and deformity. She exhibits no swelling.       Left foot: She exhibits decreased range of motion, tenderness and deformity. She exhibits no bony tenderness and no swelling.       Bilateral pes planus Tenderness over the proximal plantar fascia as well as over the plantar  aspect of the calcaneus.  Neurological: She is alert and oriented to person, place, and time. She has normal reflexes.  Psychiatric: She has a normal mood and affect.          Assessment & Plan:  1.Myofascial pain syndrome controlled. 2. Fibromyalgia syndrome doing overall better with Lyrica 150 mg 3 times a day we'll continue this dose. Recommend walking every day but lift it into 2x15 minute or 3x10 minute walks Do ice massage with frozen water bottle after walk Go to [employer] [facility]. [place] [address] [place] [employer] on [address] Continue current medicines  Over half of the 25 minute visit was spent counseling and coordination of care

## 2012-01-09 ENCOUNTER — Ambulatory Visit (HOSPITAL_BASED_OUTPATIENT_CLINIC_OR_DEPARTMENT_OTHER): Payer: 59 | Admitting: Physical Medicine & Rehabilitation

## 2012-01-09 ENCOUNTER — Encounter: Payer: Self-pay | Admitting: Physical Medicine & Rehabilitation

## 2012-01-09 ENCOUNTER — Encounter: Payer: 59 | Attending: Physical Medicine & Rehabilitation

## 2012-01-09 VITALS — BP 140/71 | HR 94 | Resp 14 | Ht 67.0 in | Wt 253.4 lb

## 2012-01-09 DIAGNOSIS — M722 Plantar fascial fibromatosis: Secondary | ICD-10-CM | POA: Insufficient documentation

## 2012-01-09 DIAGNOSIS — M76899 Other specified enthesopathies of unspecified lower limb, excluding foot: Secondary | ICD-10-CM | POA: Insufficient documentation

## 2012-01-09 DIAGNOSIS — G8929 Other chronic pain: Secondary | ICD-10-CM | POA: Insufficient documentation

## 2012-01-09 DIAGNOSIS — M25569 Pain in unspecified knee: Secondary | ICD-10-CM | POA: Insufficient documentation

## 2012-01-09 DIAGNOSIS — IMO0001 Reserved for inherently not codable concepts without codable children: Secondary | ICD-10-CM

## 2012-01-09 DIAGNOSIS — M961 Postlaminectomy syndrome, not elsewhere classified: Secondary | ICD-10-CM | POA: Insufficient documentation

## 2012-01-09 DIAGNOSIS — M797 Fibromyalgia: Secondary | ICD-10-CM

## 2012-01-09 DIAGNOSIS — M545 Low back pain, unspecified: Secondary | ICD-10-CM | POA: Insufficient documentation

## 2012-01-09 NOTE — Progress Notes (Signed)
Subjective:    Patient ID: Jessica [name], female    DOB: 09-Oct-1960, 51 y.o.   MRN: [id number]  HPI Still with plantar fascia pain on the right foot greater than left foot. Walking is increasing Has shoe inserts from a shoe store. Has increased pain around the neck area Medications have not changed Pain Inventory Average Pain 5 Pain Right Now 7 My pain is dull, stabbing, tingling and aching  In the last 24 hours, has pain interfered with the following? General activity 4 Relation with others 4 Enjoyment of life 5 What TIME of day is your pain at its worst? morning and evening Sleep (in general) Fair  Pain is worse with: walking, sitting and standing Pain improves with: rest, heat/ice, medication, TENS and injections Relief from Meds: 7  Mobility walk without assistance how many minutes can you walk? 45-60 ability to climb steps?  yes do you drive?  yes Do you have any goals in this area?  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  no  Physicians involved in your care Any changes since last visit?  no   Family History  Problem Relation Age of Onset  . Depression Mother   . Celiac disease Mother   . Hypertension Father    History   Social History  . Marital Status: Married    Spouse Name: N/A    Number of Children: N/A  . Years of Education: N/A   Social History Main Topics  . Smoking status: Never Smoker   . Smokeless tobacco: Never Used  . Alcohol Use: None  . Drug Use: None  . Sexually Active: None   Other Topics Concern  . None   Social History Narrative  . None   Past Surgical History  Procedure Date  . Spine surgery 2010    nerve stimulator implant  . Spinal fusion 2007  . Bunionectomy 1991    bilateral   Past Medical History   Diagnosis Date  . Neuromuscular disorder   . Chronic constipation   . Migraines    BP 140/71  Pulse 94  Resp 14  Ht 5\' 7"  (1.702 m)  Wt 253 lb 6.4 oz (114.941 kg)  BMI 39.69 kg/m2  SpO2 95%  LMP 01/01/2012    Review of Systems  Constitutional: Positive for unexpected weight change.  Gastrointestinal: Positive for nausea, vomiting and constipation.  Neurological: Positive for tremors, weakness and numbness.       Tingling  Psychiatric/Behavioral: The patient is nervous/anxious.   All other systems reviewed and are negative.       Objective:   Physical Exam  Nursing note and vitals reviewed.  Constitutional: She is oriented to person, place, and time. She appears well-developed.  obese  HENT:  Head: Normocephalic and atraumatic.  Eyes: Conjunctivae normal and EOM are normal. Pupils are equal, round, and reactive to light.  Musculoskeletal:  Right ankle: She exhibits normal range of motion, no swelling and no ecchymosis. no tenderness. Achilles tendon normal.  Left ankle: She exhibits normal range of motion, no swelling and no ecchymosis. no tenderness. Achilles tendon normal.  Right foot: She exhibits decreased range of motion, tenderness and deformity. She exhibits no swelling.  Left foot: She exhibits decreased range of motion, tenderness and deformity. She exhibits no bony tenderness and no swelling.  Bilateral pes planus Tenderness over the proximal plantar fascia as well as over the plantar aspect of the calcaneus.  Neurological: She is alert and oriented to person, place, and time. She has normal reflexes.  Psychiatric: She has a normal mood and affect.  Fibromyalgia tender points bilateral trapezius, bilateral low back, bilateral hip, right knee      Assessment & Plan:  1.Myofascial pain syndrome Increased pain in trapezius will do trigger point injections today. 2. Fibromyalgia syndrome doing overall better with Lyrica 150 mg 3 times a day we'll continue this  dose. Recommend walking every day 3x10 minute walks Do ice massage with frozen water bottle after walk  Continue current medicines  Trigger point injection Indication myofascial pain not responsive to medication management and other conservative care. bilateral upper trapezius marked and prepped with Betadine. 25-gauge 1.5 inch needle was used. 1 cc of lidocaine injected into each side after negative draw back for blood. Patient tolerated procedure well. Post procedure instructions given.

## 2012-01-09 NOTE — Patient Instructions (Addendum)
Plantar Fasciitis (Heel Spur Syndrome)  with Rehab  The plantar fascia is a fibrous, ligament-like, soft-tissue structure that spans the bottom of the foot. Plantar fasciitis is a condition that causes pain in the foot due to inflammation of the tissue.  SYMPTOMS   · Pain and tenderness on the underneath side of the foot.  · Pain that worsens with standing or walking.  CAUSES   Plantar fasciitis is caused by irritation and injury to the plantar fascia on the underneath side of the foot. Common mechanisms of injury include:  · Direct trauma to bottom of the foot.  · Damage to a small nerve that runs under the foot where the main fascia attaches to the heel bone.  · Stress placed on the plantar fascia due to bone spurs.  RISK INCREASES WITH:   · Activities that place stress on the plantar fascia (running, jumping, pivoting, or cutting).  · Poor strength and flexibility.  · Improperly fitted shoes.  · Tight calf muscles.  · Flat feet.  · Failure to warm-up properly before activity.  · Obesity.  PREVENTION  · Warm up and stretch properly before activity.  · Allow for adequate recovery between workouts.  · Maintain physical fitness:  · Strength, flexibility, and endurance.  · Cardiovascular fitness.  · Maintain a health body weight.  · Avoid stress on the plantar fascia.  · Wear properly fitted shoes, including arch supports for individuals who have flat feet.  PROGNOSIS   If treated properly, then the symptoms of plantar fasciitis usually resolve without surgery. However, occasionally surgery is necessary.  RELATED COMPLICATIONS   · Recurrent symptoms that may result in a chronic condition.  · Problems of the lower back that are caused by compensating for the injury, such as limping.  · Pain or weakness of the foot during push-off following surgery.  · Chronic inflammation, scarring, and partial or complete fascia tear, occurring more often from repeated injections.  TREATMENT   Treatment initially involves the use of  ice and medication to help reduce pain and inflammation. The use of strengthening and stretching exercises may help reduce pain with activity, especially stretches of the Achilles tendon. These exercises may be performed at home or with a therapist. Your caregiver may recommend that you use heel cups of arch supports to help reduce stress on the plantar fascia. Occasionally, corticosteroid injections are given to reduce inflammation. If symptoms persist for greater than 6 months despite non-surgical (conservative), then surgery may be recommended.   MEDICATION   · If pain medication is necessary, then nonsteroidal anti-inflammatory medications, such as aspirin and ibuprofen, or other minor pain relievers, such as acetaminophen, are often recommended.  · Do not take pain medication within 7 days before surgery.  · Prescription pain relievers may be given if deemed necessary by your caregiver. Use only as directed and only as much as you need.  · Corticosteroid injections may be given by your caregiver. These injections should be reserved for the most serious cases, because they may only be given a certain number of times.  HEAT AND COLD  · Cold treatment (icing) relieves pain and reduces inflammation. Cold treatment should be applied for 10 to 15 minutes every 2 to 3 hours for inflammation and pain and immediately after any activity that aggravates your symptoms. Use ice packs or massage the area with a piece of ice (ice massage).  · Heat treatment may be used prior to performing the stretching and strengthening activities prescribed   by your caregiver, physical therapist, or athletic trainer. Use a heat pack or soak the injury in warm water.  SEEK IMMEDIATE MEDICAL CARE IF:  · Treatment seems to offer no benefit, or the condition worsens.  · Any medications produce adverse side effects.  EXERCISES  RANGE OF MOTION (ROM) AND STRETCHING EXERCISES - Plantar Fasciitis (Heel Spur Syndrome)  These exercises may help you  when beginning to rehabilitate your injury. Your symptoms may resolve with or without further involvement from your physician, physical therapist or athletic trainer. While completing these exercises, remember:   · Restoring tissue flexibility helps normal motion to return to the joints. This allows healthier, less painful movement and activity.  · An effective stretch should be held for at least 30 seconds.  · A stretch should never be painful. You should only feel a gentle lengthening or release in the stretched tissue.  RANGE OF MOTION - Toe Extension, Flexion  · Sit with your right / left leg crossed over your opposite knee.  · Grasp your toes and gently pull them back toward the top of your foot. You should feel a stretch on the bottom of your toes and/or foot.  · Hold this stretch for __________ seconds.  · Now, gently pull your toes toward the bottom of your foot. You should feel a stretch on the top of your toes and or foot.  · Hold this stretch for __________ seconds.  Repeat __________ times. Complete this stretch __________ times per day.   RANGE OF MOTION - Ankle Dorsiflexion, Active Assisted  · Remove shoes and sit on a chair that is preferably not on a carpeted surface.  · Place right / left foot under knee. Extend your opposite leg for support.  · Keeping your heel down, slide your right / left foot back toward the chair until you feel a stretch at your ankle or calf. If you do not feel a stretch, slide your bottom forward to the edge of the chair, while still keeping your heel down.  · Hold this stretch for __________ seconds.  Repeat __________ times. Complete this stretch __________ times per day.   STRETCH - Gastroc, Standing  · Place hands on wall.  · Extend right / left leg, keeping the front knee somewhat bent.  · Slightly point your toes inward on your back foot.  · Keeping your right / left heel on the floor and your knee straight, shift your weight toward the wall, not allowing your back to  arch.  · You should feel a gentle stretch in the right / left calf. Hold this position for __________ seconds.  Repeat __________ times. Complete this stretch __________ times per day.  STRETCH - Soleus, Standing  · Place hands on wall.  · Extend right / left leg, keeping the other knee somewhat bent.  · Slightly point your toes inward on your back foot.  · Keep your right / left heel on the floor, bend your back knee, and slightly shift your weight over the back leg so that you feel a gentle stretch deep in your back calf.  · Hold this position for __________ seconds.  Repeat __________ times. Complete this stretch __________ times per day.  STRETCH - Gastrocsoleus, Standing   Note: This exercise can place a lot of stress on your foot and ankle. Please complete this exercise only if specifically instructed by your caregiver.   · Place the ball of your right / left foot on a step, keeping   your other foot firmly on the same step.  · Hold on to the wall or a rail for balance.  · Slowly lift your other foot, allowing your body weight to press your heel down over the edge of the step.  · You should feel a stretch in your right / left calf.  · Hold this position for __________ seconds.  · Repeat this exercise with a slight bend in your right / left knee.  Repeat __________ times. Complete this stretch __________ times per day.   STRENGTHENING EXERCISES - Plantar Fasciitis (Heel Spur Syndrome)   These exercises may help you when beginning to rehabilitate your injury. They may resolve your symptoms with or without further involvement from your physician, physical therapist or athletic trainer. While completing these exercises, remember:   · Muscles can gain both the endurance and the strength needed for everyday activities through controlled exercises.  · Complete these exercises as instructed by your physician, physical therapist or athletic trainer. Progress the resistance and repetitions only as guided.  STRENGTH -  Towel Curls  · Sit in a chair positioned on a non-carpeted surface.  · Place your foot on a towel, keeping your heel on the floor.  · Pull the towel toward your heel by only curling your toes. Keep your heel on the floor.  · If instructed by your physician, physical therapist or athletic trainer, add ____________________ at the end of the towel.  Repeat __________ times. Complete this exercise __________ times per day.  STRENGTH - Ankle Inversion  · Secure one end of a rubber exercise band/tubing to a fixed object (table, pole). Loop the other end around your foot just before your toes.  · Place your fists between your knees. This will focus your strengthening at your ankle.  · Slowly, pull your big toe up and in, making sure the band/tubing is positioned to resist the entire motion.  · Hold this position for __________ seconds.  · Have your muscles resist the band/tubing as it slowly pulls your foot back to the starting position.  Repeat __________ times. Complete this exercises __________ times per day.   Document Released: 01/06/2005 Document Revised: 03/31/2011 Document Reviewed: 04/20/2008  [employer]® Patient Information ©2013 [employer].

## 2012-02-24 ENCOUNTER — Encounter: Payer: Self-pay | Admitting: Physical Medicine & Rehabilitation

## 2012-02-24 ENCOUNTER — Ambulatory Visit (HOSPITAL_BASED_OUTPATIENT_CLINIC_OR_DEPARTMENT_OTHER): Payer: 59 | Admitting: Physical Medicine & Rehabilitation

## 2012-02-24 ENCOUNTER — Encounter: Payer: 59 | Attending: Physical Medicine & Rehabilitation

## 2012-02-24 VITALS — BP 136/67 | HR 99 | Resp 14 | Ht 67.0 in | Wt 254.0 lb

## 2012-02-24 DIAGNOSIS — M545 Low back pain, unspecified: Secondary | ICD-10-CM | POA: Insufficient documentation

## 2012-02-24 DIAGNOSIS — G8929 Other chronic pain: Secondary | ICD-10-CM | POA: Insufficient documentation

## 2012-02-24 DIAGNOSIS — M722 Plantar fascial fibromatosis: Secondary | ICD-10-CM

## 2012-02-24 DIAGNOSIS — M76899 Other specified enthesopathies of unspecified lower limb, excluding foot: Secondary | ICD-10-CM | POA: Insufficient documentation

## 2012-02-24 DIAGNOSIS — M797 Fibromyalgia: Secondary | ICD-10-CM

## 2012-02-24 DIAGNOSIS — M25569 Pain in unspecified knee: Secondary | ICD-10-CM | POA: Insufficient documentation

## 2012-02-24 DIAGNOSIS — IMO0001 Reserved for inherently not codable concepts without codable children: Secondary | ICD-10-CM

## 2012-02-24 DIAGNOSIS — M961 Postlaminectomy syndrome, not elsewhere classified: Secondary | ICD-10-CM | POA: Insufficient documentation

## 2012-02-24 MED ORDER — CYCLOBENZAPRINE HCL 5 MG PO TABS: 10.0000 mg | ORAL_TABLET | Freq: Every day | ORAL | Status: DC

## 2012-02-24 NOTE — Progress Notes (Signed)
Subjective:    Patient ID: Jessica [name], female    DOB: 02/09/1960, 52 y.o.   MRN: [id number]  HPI Still with plantar fascia pain on the right foot greater than left foot.Left foot plantar fascia pain almost resolved with taping. Walking is increasing Has shoe inserts from a shoe store. Has increased pain around the Right shoulder Medications have not changed Pain Inventory Average Pain 4 Pain Right Now 6 My pain is constant, burning, dull, tingling and aching  In the last 24 hours, has pain interfered with the following? General activity 5 Relation with others 5 Enjoyment of life 4 What TIME of day is your pain at its worst? morning and evening Sleep (in general) Fair  Pain is worse with: walking, sitting, standing and some activites Pain improves with: rest, heat/ice, pacing activities, medication and injections Relief from Meds: 6  Mobility walk without assistance how many minutes can you walk? 45-60 ability to climb steps?  yes do you drive?  yes Do you have any goals in this area?  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  no  Physicians involved in your care Any changes since last visit?  no   Family History  Problem Relation Age of Onset  . Depression Mother   . Celiac disease Mother   . Hypertension Father    History   Social History  . Marital Status: Married    Spouse Name: N/A    Number of Children: N/A  . Years of Education: N/A   Social History Main Topics  . Smoking status: Never Smoker   . Smokeless tobacco: Never Used  . Alcohol Use: None  . Drug Use: None  . Sexually Active: None   Other Topics Concern  . None   Social History Narrative  . None   Past Surgical History  Procedure Date  . Spine surgery 2010    nerve stimulator implant  . Spinal fusion 2007  .  Bunionectomy 1991    bilateral   Past Medical History  Diagnosis Date  . Neuromuscular disorder   . Chronic constipation   . Migraines    BP 136/67  Pulse 99  Resp 14  Ht 5\' 7"  (1.702 m)  Wt 254 lb (115.214 kg)  BMI 39.78 kg/m2  SpO2 99%     Review of Systems  Constitutional: Positive for unexpected weight change.  HENT: Positive for neck pain.   Gastrointestinal: Positive for constipation.  Musculoskeletal: Positive for back pain.  Neurological: Positive for tremors.  All other systems reviewed and are negative.       Objective:   Physical Exam  Constitutional: She is oriented to person, place, and time. She appears well-developed.  obese  HENT:  Head: Normocephalic and atraumatic.  Eyes: Conjunctivae normal and EOM are normal. Pupils are equal, round, and reactive to light.  Musculoskeletal:  Right ankle: She exhibits normal range of motion, no swelling and no ecchymosis. no tenderness. Achilles tendon normal.  Left ankle: She exhibits normal range of motion, no swelling and no ecchymosis. no tenderness. Achilles tendon normal.  Right foot: She exhibits  tenderness Over plantar surface of the heel. She exhibits no swelling.  Left foot:. She exhibits no bony tenderness and no swelling.  Bilateral pes planus Tenderness over the proximal plantar fascia as well as over the plantar aspect of the calcaneus.  Neurological: She is alert and oriented to person, place, and time. She has normal reflexes.  Psychiatric: She has a normal mood and affect.  Fibromyalgia tender points bilateral trapezius Right greater than left       Assessment & Plan:  1.Myofascial pain syndrome Increased pain in trapezius will do trigger point injections today. 2. Fibromyalgia syndrome doing overall better with Lyrica 150 mg 3 times a day we'll continue this dose. Recommend walking every day 3x10 minute walks Do ice massage with frozen water bottle after walk

## 2012-04-02 ENCOUNTER — Other Ambulatory Visit: Payer: Self-pay | Admitting: Physical Medicine & Rehabilitation

## 2012-04-15 ENCOUNTER — Telehealth: Payer: Self-pay

## 2012-04-15 MED ORDER — PREGABALIN 150 MG PO CAPS: 150.0000 mg | ORAL_CAPSULE | Freq: Three times a day (TID) | ORAL | Status: DC

## 2012-04-15 NOTE — Telephone Encounter (Signed)
Left message for patient to call with [facility] phone number.

## 2012-04-15 NOTE — Telephone Encounter (Signed)
Patient request lyrica refill sent to [facility].

## 2012-04-15 NOTE — Telephone Encounter (Signed)
Lyrica called into [facility] at [phone]

## 2012-05-20 DIAGNOSIS — M722 Plantar fascial fibromatosis: Secondary | ICD-10-CM

## 2012-05-20 HISTORY — DX: Plantar fascial fibromatosis: M72.2

## 2012-05-24 ENCOUNTER — Telehealth: Payer: Self-pay | Admitting: Physical Medicine & Rehabilitation

## 2012-05-24 NOTE — Telephone Encounter (Signed)
FYI: Ms. Wendt called stating that the programmer for her spinal stimulator has been lost.  This device allows her to turn it on and off, and she cannot do so without it.  It has been over a year since she saw the doctor who implanted the device, so [employer] is sending an RX to Dr. Wynn Banker to sign for a new one.  It will sent by fax, and she needs it ASAP.

## 2012-05-24 NOTE — Telephone Encounter (Signed)
I will sign when I receive it

## 2012-05-25 ENCOUNTER — Ambulatory Visit: Payer: 59 | Admitting: Physical Medicine & Rehabilitation

## 2012-05-28 ENCOUNTER — Encounter: Payer: 59 | Attending: Physical Medicine & Rehabilitation

## 2012-05-28 ENCOUNTER — Ambulatory Visit: Payer: 59 | Admitting: Physical Medicine & Rehabilitation

## 2012-05-28 DIAGNOSIS — M545 Low back pain, unspecified: Secondary | ICD-10-CM | POA: Insufficient documentation

## 2012-05-28 DIAGNOSIS — M961 Postlaminectomy syndrome, not elsewhere classified: Secondary | ICD-10-CM | POA: Insufficient documentation

## 2012-05-28 DIAGNOSIS — M25569 Pain in unspecified knee: Secondary | ICD-10-CM | POA: Insufficient documentation

## 2012-05-28 DIAGNOSIS — G8929 Other chronic pain: Secondary | ICD-10-CM | POA: Insufficient documentation

## 2012-05-28 DIAGNOSIS — M722 Plantar fascial fibromatosis: Secondary | ICD-10-CM | POA: Insufficient documentation

## 2012-05-28 DIAGNOSIS — M76899 Other specified enthesopathies of unspecified lower limb, excluding foot: Secondary | ICD-10-CM | POA: Insufficient documentation

## 2012-06-01 ENCOUNTER — Other Ambulatory Visit: Payer: Self-pay | Admitting: Dermatology

## 2012-06-18 ENCOUNTER — Ambulatory Visit: Payer: 59 | Admitting: Physical Medicine & Rehabilitation

## 2012-06-24 ENCOUNTER — Encounter: Payer: 59 | Attending: Physical Medicine and Rehabilitation | Admitting: Physical Medicine and Rehabilitation

## 2012-06-24 ENCOUNTER — Encounter: Payer: Self-pay | Admitting: Physical Medicine and Rehabilitation

## 2012-06-24 VITALS — BP 142/73 | HR 100 | Resp 16 | Ht 67.0 in | Wt 258.0 lb

## 2012-06-24 DIAGNOSIS — M961 Postlaminectomy syndrome, not elsewhere classified: Secondary | ICD-10-CM | POA: Insufficient documentation

## 2012-06-24 DIAGNOSIS — M722 Plantar fascial fibromatosis: Secondary | ICD-10-CM | POA: Insufficient documentation

## 2012-06-24 DIAGNOSIS — IMO0001 Reserved for inherently not codable concepts without codable children: Secondary | ICD-10-CM | POA: Insufficient documentation

## 2012-06-24 MED ORDER — DICLOFENAC SODIUM 1 % TD GEL: 2.0000 g | Freq: Four times a day (QID) | TRANSDERMAL | Status: DC

## 2012-06-24 NOTE — Progress Notes (Signed)
Subjective:    Patient ID: Jessica [name], female    DOB: 1960-12-02, 52 y.o.   MRN: [id number]  HPI The patient is a year old female female, who presents with , which radiates into her LE posterior , bilateral LBP . The symptoms started years ago. The patient complains about moderate pain. Patient also complains about numbness and tingling in the same distribution . She describes the pain as constant sharp and burning . Applying heat, taking medications , applying ice, epson salt baths changing positions, alleviate the symptoms. Prolonged standing and sitting aggrevates the symptoms. The patient grades his pain as a  6/10. She states, that she walks about 15-82min per day. She also does some stretching exercises regularly. Pain Inventory Average Pain 7 Pain Right Now 6 My pain is constant, sharp, burning, dull, tingling and aching  In the last 24 hours, has pain interfered with the following? General activity 8 Relation with others 8 Enjoyment of life 8 What TIME of day is your pain at its worst? constant Sleep (in general) NA  Pain is worse with: walking, sitting, inactivity, standing and some activites Pain improves with: rest, heat/ice, pacing activities, medication and injections Relief from Meds: 4  Mobility walk without assistance how many minutes can you walk? 30-40 ability to climb steps?  yes do you drive?  yes transfers alone Do you have any goals in this area?  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  no  Neuro/Psych weakness numbness tremor tingling trouble walking depression  Prior Studies Any changes since last visit?  no  Physicians involved in your care Any changes since last visit?  no   Family History  Problem Relation Age of Onset  . Depression Mother   . Celiac disease Mother   . Hypertension Father     History   Social History  . Marital Status: Married    Spouse Name: N/A    Number of Children: N/A  . Years of Education: N/A   Social History Main Topics  . Smoking status: Never Smoker   . Smokeless tobacco: Never Used  . Alcohol Use: None  . Drug Use: None  . Sexually Active: None   Other Topics Concern  . None   Social History Narrative  . None   Past Surgical History  Procedure Laterality Date  . Spine surgery  2010    nerve stimulator implant  . Spinal fusion  2007  . Bunionectomy  1991    bilateral   Past Medical History  Diagnosis Date  . Neuromuscular disorder   . Chronic constipation   . Migraines   . Plantar fasciitis 05-2012   BP 142/73  Pulse 100  Resp 16  Ht 5\' 7"  (1.702 m)  Wt 258 lb (117.028 kg)  BMI 40.4 kg/m2  SpO2 97%  LMP 06/04/2012     Review of Systems  Constitutional: Positive for unexpected weight change.  Gastrointestinal: Positive for constipation.  Musculoskeletal: Positive for gait problem.  Neurological: Positive for tremors, weakness and numbness.       Tingling  Psychiatric/Behavioral: Positive for dysphoric mood.  All other systems reviewed and are negative.       Objective:   Physical Exam  Constitutional: She is oriented to person, place, and time. She appears well-developed and well-nourished.  HENT:  Head: Normocephalic.  Neck: Neck supple.  Musculoskeletal: She exhibits tenderness.  Neurological: She is alert and oriented to person, place, and time.  Skin: Skin is warm and dry.  Psychiatric: She has a normal mood and affect.    Symmetric normal motor tone is noted throughout. Normal muscle bulk. Muscle testing reveals 5/5 muscle strength of the upper extremity, and 5/5 of the lower extremity. Full range of motion in upper and lower extremities. ROM of spine is restricted. Fine motor movements are normal in both hands. Sensory is intact and symmetric to light touch, pinprick and proprioception. DTR in the  upper and lower extremity are present and symmetric 2+. No clonus is noted.  Patient arises from chair without difficulty. Narrow based gait with normal arm swing bilateral , able to walk on heels and toes . Tandem walk is stable. No pronator drift. Rhomberg negative. Pes planus bilateral        Assessment & Plan:  1.Post laminectomy syndrome, Hx of PSF L4-5- S1in 2007, by Dr. Venetia Maxon, SCS by Dr. Shon Baton in 2010  2. Fibromyalgia syndrome doing overall better with Lyrica 150 mg 3 times a day we'll continue this dose.  Recommend continuing with her walking every day for 15-20 min. Also continue with stretching exercises, and try to look into aquatic exercising.  3. Plantar fasciitis continue with ice massage, also prescribed Voltaren gel Follow up in 3 month  Do ice massage with frozen water bottle after walk

## 2012-06-24 NOTE — Patient Instructions (Addendum)
Continue with your exercise program. You could try Arnica cream for your joint and muscle pain.

## 2012-08-05 ENCOUNTER — Other Ambulatory Visit: Payer: Self-pay

## 2012-08-05 MED ORDER — CYCLOBENZAPRINE HCL 5 MG PO TABS: ORAL_TABLET | ORAL | Status: DC

## 2012-09-24 ENCOUNTER — Encounter: Payer: 59 | Admitting: Physical Medicine and Rehabilitation

## 2012-10-06 ENCOUNTER — Encounter: Payer: Self-pay | Admitting: Physical Medicine and Rehabilitation

## 2012-10-06 ENCOUNTER — Encounter: Payer: 59 | Attending: Physical Medicine and Rehabilitation | Admitting: Physical Medicine and Rehabilitation

## 2012-10-06 VITALS — BP 138/81 | HR 105 | Resp 14 | Ht 67.0 in | Wt 260.0 lb

## 2012-10-06 DIAGNOSIS — M542 Cervicalgia: Secondary | ICD-10-CM | POA: Insufficient documentation

## 2012-10-06 DIAGNOSIS — IMO0001 Reserved for inherently not codable concepts without codable children: Secondary | ICD-10-CM

## 2012-10-06 DIAGNOSIS — M961 Postlaminectomy syndrome, not elsewhere classified: Secondary | ICD-10-CM

## 2012-10-06 DIAGNOSIS — M722 Plantar fascial fibromatosis: Secondary | ICD-10-CM | POA: Insufficient documentation

## 2012-10-06 MED ORDER — CYCLOBENZAPRINE HCL 5 MG PO TABS: ORAL_TABLET | ORAL | Status: DC

## 2012-10-06 MED ORDER — DICLOFENAC SODIUM 1 % TD GEL: 2.0000 g | Freq: Four times a day (QID) | TRANSDERMAL | Status: DC

## 2012-10-06 NOTE — Patient Instructions (Signed)
Continue with your walking and exercise program as pain permits

## 2012-10-06 NOTE — Progress Notes (Signed)
Subjective:    Patient ID: Jessica [name], female    DOB: [birth year], 52 y.o.   MRN: [id number]  HPI The patient is a 52 year old female, who presents with LBP , which radiates into her LE posterior , bilateral  . The symptoms started years ago. The patient complains about moderate pain. Patient also complains about numbness and tingling in the same distribution . She describes the pain as constant sharp and burning . Applying heat, taking medications , applying ice, epson salt baths changing positions, alleviate the symptoms. Prolonged standing and sitting aggrevates the symptoms. The patient grades his pain as a 8/10. She states, that she walks about 15-30min per day. She also does some stretching exercises regularly. 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   Pain Inventory Average Pain 8 Pain Right Now 8 My pain is sharp, burning, dull, tingling and aching  In the last 24 hours, has pain interfered with the following? General activity 7 Relation with others 8 Enjoyment of life 8 What TIME of day is your pain at its worst? morning evening and night Sleep (in general) Fair  Pain is worse with: walking, sitting, inactivity, standing and some activites Pain improves with: rest, heat/ice, pacing activities, medication and stem Relief from Meds: 6  Mobility walk without assistance how many minutes can you walk? 30-45 ability to climb steps?  yes do you drive?  yes Do you have any goals in this area?  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  no  Neuro/Psych weakness numbness tremor tingling  Prior Studies Any changes since last visit?  no  Physicians involved in your care Any changes since last visit?  no   Family History  Problem Relation Age of Onset  .  Depression Mother   . Celiac disease Mother   . Hypertension Father    History   Social History  . Marital Status: Married    Spouse Name: N/A    Number of Children: N/A  . Years of Education: N/A   Social History Main Topics  . Smoking status: Never Smoker   . Smokeless tobacco: Never Used  . Alcohol Use: None  . Drug Use: None  . Sexual Activity: None   Other Topics Concern  . None   Social History Narrative  . None   Past Surgical History  Procedure Laterality Date  . Spine surgery  2010    nerve stimulator implant  . Spinal fusion  2007  . Bunionectomy  1991    bilateral   Past Medical History  Diagnosis Date  . Neuromuscular disorder   . Chronic constipation   . Migraines   . Plantar fasciitis 05-2012   BP 138/81  Pulse 105  Resp 14  Ht 5\' 7"  (1.702 m)  Wt 260 lb (117.935 kg)  BMI 40.71 kg/m2  SpO2 97%     Review of Systems  Constitutional: Positive for unexpected weight change.  Gastrointestinal: Positive for constipation.  Neurological: Positive for tremors, weakness and numbness.  All other systems reviewed and are negative.       Objective:   Physical Exam Constitutional: She is oriented to person, place, and time. She appears well-developed and well-nourished.  HENT:  Head: Normocephalic.  Neck: Neck supple.  Musculoskeletal:  She exhibits tenderness.  Neurological: She is alert and oriented to person, place, and time.  Skin: Skin is warm and dry.  Psychiatric: She has a normal mood and affect.  Symmetric normal motor tone is noted throughout, increased tension/muscle tone in trapezius descendens bilateral, worse on the left. Normal muscle bulk. Muscle testing reveals 5/5 muscle strength of the upper extremity, and 5/5 of the lower extremity. Full range of motion in upper and lower extremities. ROM of spine is restricted. Fine motor movements are normal in both hands.  Sensory is intact and symmetric to light touch, pinprick and  proprioception.  DTR in the upper and lower extremity are present and symmetric 2+. No clonus is noted.  Patient arises from chair without difficulty. Narrow based gait with normal arm swing bilateral , able to walk on heels and toes . Tandem walk is stable. No pronator drift. Rhomberg negative.  Pes planus bilateral         Assessment & Plan:  1.Post laminectomy syndrome, Hx of PSF L4-5- S1in 2007, by Dr. Venetia Maxon, SCS by Dr. Shon Baton in 2010  2. Fibromyalgia syndrome doing overall better with Lyrica 150 mg 3 times a day we'll continue this dose. 3. Bilateral neck pain, muscle tension, continue with flexeril and naproxen, also could use volteren gel for her neck. Advised patient to apply heat to her neck, also showed her some posture training, McKenzie exercises to improve her posture and relax her neck. 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  Recommend continuing with her walking every day for 15-20 min. Also continue with stretching exercises..  4. Plantar fasciitis continue with ice massage, refilled Voltaren gel  Follow up in 3 month  Do ice massage with frozen water bottle after walk

## 2012-10-08 ENCOUNTER — Other Ambulatory Visit: Payer: Self-pay

## 2012-10-08 MED ORDER — PREGABALIN 150 MG PO CAPS: 150.0000 mg | ORAL_CAPSULE | Freq: Three times a day (TID) | ORAL | Status: DC

## 2012-10-11 ENCOUNTER — Other Ambulatory Visit: Payer: Self-pay

## 2012-10-11 MED ORDER — PREGABALIN 150 MG PO CAPS: 150.0000 mg | ORAL_CAPSULE | Freq: Three times a day (TID) | ORAL | Status: DC

## 2012-10-11 NOTE — Telephone Encounter (Signed)
Patient called regarding her lyrica.  She [facility]  We [facility]ed this in, per [facility] they do not take verbal order for this.

## 2012-10-12 ENCOUNTER — Telehealth: Payer: Self-pay | Admitting: *Deleted

## 2012-10-12 MED ORDER — PREGABALIN 150 MG PO CAPS: 150.0000 mg | ORAL_CAPSULE | Freq: Three times a day (TID) | ORAL | Status: DC

## 2012-10-12 NOTE — Telephone Encounter (Signed)
[facility], they cannot take phone in orders.

## 2012-10-12 NOTE — Telephone Encounter (Signed)
Jessica [name] called saying she spoke with Carollee Herter yesterday about her PA for Lyrica which she was told was sent.  She called [facility] and they had not received it.  Asking that it be sent again.  They have approved a 7 day fill at [facility] [place] until she can get this taken care of. ( order called to [facility] for the 7 day supply but could not find the PA)

## 2012-10-14 ENCOUNTER — Other Ambulatory Visit: Payer: Self-pay

## 2012-10-14 MED ORDER — CYCLOBENZAPRINE HCL 5 MG PO TABS: ORAL_TABLET | ORAL | Status: DC

## 2012-11-29 ENCOUNTER — Other Ambulatory Visit: Payer: Self-pay | Admitting: Family Medicine

## 2012-11-29 ENCOUNTER — Other Ambulatory Visit (HOSPITAL_COMMUNITY)
Admission: RE | Admit: 2012-11-29 | Discharge: 2012-11-29 | Disposition: A | Discharge status: Home / Self Care | Payer: 59 | Source: Ambulatory Visit | Attending: Family Medicine | Admitting: Family Medicine

## 2012-11-29 DIAGNOSIS — Z1151 Encounter for screening for human papillomavirus (HPV): Secondary | ICD-10-CM | POA: Insufficient documentation

## 2012-11-29 DIAGNOSIS — Z01419 Encounter for gynecological examination (general) (routine) without abnormal findings: Secondary | ICD-10-CM | POA: Insufficient documentation

## 2012-12-07 ENCOUNTER — Encounter: Payer: Self-pay | Admitting: Physical Medicine and Rehabilitation

## 2012-12-07 ENCOUNTER — Encounter: Payer: 59 | Attending: Physical Medicine and Rehabilitation | Admitting: Physical Medicine and Rehabilitation

## 2012-12-07 VITALS — BP 162/70 | HR 100 | Resp 14 | Ht 67.0 in | Wt 262.0 lb

## 2012-12-07 DIAGNOSIS — M545 Low back pain: Secondary | ICD-10-CM

## 2012-12-07 DIAGNOSIS — M542 Cervicalgia: Secondary | ICD-10-CM | POA: Insufficient documentation

## 2012-12-07 DIAGNOSIS — M722 Plantar fascial fibromatosis: Secondary | ICD-10-CM | POA: Insufficient documentation

## 2012-12-07 DIAGNOSIS — M961 Postlaminectomy syndrome, not elsewhere classified: Secondary | ICD-10-CM | POA: Insufficient documentation

## 2012-12-07 DIAGNOSIS — IMO0001 Reserved for inherently not codable concepts without codable children: Secondary | ICD-10-CM | POA: Insufficient documentation

## 2012-12-07 MED ORDER — PREGABALIN 150 MG PO CAPS: 150.0000 mg | ORAL_CAPSULE | Freq: Three times a day (TID) | ORAL | Status: AC

## 2012-12-07 NOTE — Progress Notes (Signed)
Subjective:    Patient ID: Jessica [name], female    DOB: 19-Apr-1960, 52 y.o.   MRN: [id number]  HPI The patient is a 52 year old female, who presents with LBP , which radiates into her LE posterior , bilateral . The symptoms started years ago. The patient complains about moderate pain. Patient also complains about numbness and tingling in the same distribution . She describes the pain as constant sharp and burning . Applying heat, taking medications , applying ice, epson salt baths changing positions, alleviate the symptoms. Prolonged standing and sitting aggrevates the symptoms. The patient grades his pain as a 8/10. She states, that she walks about 15-58min per day. She also does some stretching exercises regularly.    Pain Inventory Average Pain 8 Pain Right Now 8 My pain is sharp, dull, tingling and aching  In the last 24 hours, has pain interfered with the following? General activity 9 Relation with others 9 Enjoyment of life 9 What TIME of day is your pain at its worst? morning, evening Sleep (in general) Fair  Pain is worse with: walking, bending, sitting, standing and some activites Pain improves with: rest, heat/ice, pacing activities, medication and TENS Relief from Meds: 6  Mobility walk without assistance how many minutes can you walk? 20-45 do you drive?  yes Do you have any goals in this area?  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  yes  Neuro/Psych weakness numbness tingling  Prior Studies Any changes since last visit?  no  Physicians involved in your care Primary care Dr Gerri Spore at [facility]   Family History  Problem Relation Age of Onset  . Depression Mother   . Celiac disease Mother   . Hypertension Father    History   Social History  . Marital Status: Married    Spouse Name: N/A    Number of Children: N/A  . Years of  Education: N/A   Social History Main Topics  . Smoking status: Never Smoker   . Smokeless tobacco: Never Used  . Alcohol Use: None  . Drug Use: None  . Sexual Activity: None   Other Topics Concern  . None   Social History Narrative  . None   Past Surgical History  Procedure Laterality Date  . Spine surgery  2010    nerve stimulator implant  . Spinal fusion  2007  . Bunionectomy  1991    bilateral   Past Medical History  Diagnosis Date  . Neuromuscular disorder   . Chronic constipation   . Migraines   . Plantar fasciitis 05-2012   BP 162/70  Pulse 100  Resp 14  Ht 5\' 7"  (1.702 m)  Wt 262 lb (118.842 kg)  BMI 41.03 kg/m2  SpO2 100%     Review of Systems  Constitutional: Positive for unexpected weight change.  Gastrointestinal: Positive for constipation.  Neurological: Positive for weakness and numbness.  All other systems reviewed and are negative.       Objective:   Physical Exam Constitutional: She is oriented to person, place, and time. She appears well-developed and well-nourished.  HENT:  Head: Normocephalic.  Neck: Neck supple.  Musculoskeletal: She exhibits tenderness.  Neurological: She is alert and oriented to person, place, and time.  Skin: Skin is warm and dry.  Psychiatric: She has a normal mood and affect.  Symmetric normal motor tone is noted  throughout, increased tension/muscle tone in trapezius descendens bilateral, worse on the left. Normal muscle bulk. Muscle testing reveals 5/5 muscle strength of the upper extremity, and 5/5 of the lower extremity. Full range of motion in upper and lower extremities. ROM of spine is restricted. Fine motor movements are normal in both hands.  Sensory is intact and symmetric to light touch, pinprick and proprioception.  DTR in the upper and lower extremity are present and symmetric 2+. No clonus is noted.  Patient arises from chair without difficulty. Narrow based gait with normal arm swing bilateral , able  to walk on heels and toes . Tandem walk is stable. No pronator drift. Rhomberg negative.  Pes planus bilateral         Assessment & Plan:  1.Post laminectomy syndrome, Hx of PSF L4-5- S1in 2007, by Dr. Venetia Maxon, SCS by Dr. Shon Baton in 2010  2. Fibromyalgia syndrome doing overall better with Lyrica 150 mg 3 times a day we'll continue this dose.  3. Bilateral neck pain, muscle tension, continue with flexeril and naproxen, also could use volteren gel for her neck. Advised patient to apply heat to her neck, also showed her some posture training, McKenzie exercises to improve her posture and relax her neck. 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 Recommend continuing with her walking every day for 15-20 min. Also continue with stretching exercises..  4. Plantar fasciitis continue with ice massage, refilled Voltaren gel  Showed patient some stretches for her back, she was doing stretches which could harm her back more than being beneficial. Follow up in 3 month  Do ice massage with frozen water bottle after walk

## 2012-12-07 NOTE — Patient Instructions (Signed)
Continue with your walking as tolerated, and do the stretches I showed you today

## 2013-01-03 ENCOUNTER — Ambulatory Visit: Payer: 59 | Admitting: Physical Medicine and Rehabilitation

## 2013-03-07 ENCOUNTER — Encounter: Payer: Self-pay | Admitting: Physical Medicine & Rehabilitation

## 2013-03-07 ENCOUNTER — Ambulatory Visit (HOSPITAL_BASED_OUTPATIENT_CLINIC_OR_DEPARTMENT_OTHER): Payer: 59 | Admitting: Physical Medicine & Rehabilitation

## 2013-03-07 ENCOUNTER — Encounter: Payer: 59 | Attending: Physical Medicine and Rehabilitation

## 2013-03-07 VITALS — BP 150/88 | HR 96 | Resp 14 | Ht 67.0 in | Wt 265.0 lb

## 2013-03-07 DIAGNOSIS — M542 Cervicalgia: Secondary | ICD-10-CM | POA: Insufficient documentation

## 2013-03-07 DIAGNOSIS — M961 Postlaminectomy syndrome, not elsewhere classified: Secondary | ICD-10-CM

## 2013-03-07 DIAGNOSIS — IMO0001 Reserved for inherently not codable concepts without codable children: Secondary | ICD-10-CM | POA: Insufficient documentation

## 2013-03-07 DIAGNOSIS — M7060 Trochanteric bursitis, unspecified hip: Secondary | ICD-10-CM

## 2013-03-07 DIAGNOSIS — M76899 Other specified enthesopathies of unspecified lower limb, excluding foot: Secondary | ICD-10-CM

## 2013-03-07 DIAGNOSIS — M797 Fibromyalgia: Secondary | ICD-10-CM

## 2013-03-07 DIAGNOSIS — M722 Plantar fascial fibromatosis: Secondary | ICD-10-CM | POA: Insufficient documentation

## 2013-03-07 MED ORDER — CYCLOBENZAPRINE HCL 5 MG PO TABS: ORAL_TABLET | ORAL | Status: AC

## 2013-03-07 MED ORDER — DICLOFENAC SODIUM 1 % TD GEL: 2.0000 g | Freq: Four times a day (QID) | TRANSDERMAL | Status: AC

## 2013-03-07 NOTE — Progress Notes (Signed)
Subjective:    Patient ID: Jessica [name], female    DOB: 06/30/1960, 53 y.o.   MRN: [id number]  HPI Foot pain improved with orthotics from podiatrist Using spinal cord stimulator at night Left hip pain, PT told her pain is centralizing Doing physical therapy in aquatic  Moving to [facility] next week  [facility]- Spinal fusion 2007 SCS 2010 Pain Inventory Average Pain 7 Pain Right Now 8 My pain is sharp, burning, stabbing, tingling and aching  In the last 24 hours, has pain interfered with the following? General activity 6 Relation with others 8 Enjoyment of life 8 What TIME of day is your pain at its worst? morning, evening, night Sleep (in general) Fair  Pain is worse with: walking, bending, sitting and standing Pain improves with: rest, heat/ice, therapy/exercise, pacing activities and STEM Relief from Meds: 5  Mobility walk without assistance how many minutes can you walk? 45-60 ability to climb steps?  yes do you drive?  yes transfers alone  Function not employed: date last employed 1/31 I need assistance with the following:  household duties and shopping  Neuro/Psych weakness numbness tremor tingling spasms confusion  Prior Studies Any changes since last visit?  no  Physicians involved in your care Any changes since last visit?  yes Primary care Dr. Hyman Hopes   Family History  Problem Relation Age of Onset  . Depression Mother   . Celiac disease Mother   . Hypertension Father    History   Social History  . Marital Status: Married    Spouse Name: N/A    Number of Children: N/A  . Years of Education: N/A   Social History Main Topics  . Smoking status: Never Smoker   . Smokeless tobacco: Never Used  . Alcohol Use: None  . Drug Use: None  . Sexual Activity: None   Other Topics Concern  . None   Social History Narrative  . None   Past Surgical History  Procedure Laterality Date  . Spine surgery  2010    nerve stimulator  implant  . Spinal fusion  2007  . Bunionectomy  1991    bilateral   Past Medical History  Diagnosis Date  . Neuromuscular disorder   . Chronic constipation   . Migraines   . Plantar fasciitis 05-2012   BP 150/88  Pulse 96  Resp 14  Ht 5\' 7"  (1.702 m)  Wt 265 lb (120.203 kg)  BMI 41.50 kg/m2  SpO2 97%  Opioid Risk Score:   Fall Risk Score: Low Fall Risk (0-5 points) (pt educated on fall risk, declined brochure)   Review of Systems  Constitutional: Positive for unexpected weight change.  Gastrointestinal: Positive for constipation.  Musculoskeletal: Positive for back pain and neck pain.  Neurological: Positive for weakness and numbness.       Tingling, spasms  Psychiatric/Behavioral: Positive for confusion.  All other systems reviewed and are negative.       Objective:   Physical Exam  Tenderness to palpation over the left gluteus medius More severe tenderness to palpation over the left greater trochanter of the hip No tenderness over the right greater trochanter No tenderness in the paraspinal muscles Negative straight leg raising test. Normal strength in the lower extremities No pain with hip internal and external rotation Deep tendon reflexes 2+ bilateral patellar and bilateral Achilles      Assessment & Plan:  #1. Left hip pain probably multifactorial however does appear to have trochanteric bursitis  Left  Trochanteric bursa injection  without ultrasound guidance  Indication Trochanteric bursitis. Exam has tenderness over the greater trochanter of the hip. Pain has not responded to conservative care such as exercise therapy and oral medications. Pain interferes with sleep or with mobility Informed consent was obtained after describing risks and benefits of the procedure with the patient these include bleeding bruising and infection. Patient has signed written consent form. Patient placed in a lateral decubitus position with the affected hip superior. Point  of maximal pain was palpated marked and prepped with Betadine and entered with a 25 ga 3 inch spinal  needle to bone contact. Needle slightly withdrawn then 6mg  of betamethasone with 4 cc 1% lidocaine were injected. Patient tolerated procedure well. Post procedure instructions given.   2. Lumbar postlaminectomy syndrome no clinical evidence of radicular pain. Continues with spinal cord stimulator. Also continues with Lyrica 150 mg 3 times per day  3. Fibromyalgia syndrome doing well with Lyrica 150 mg 3 times per day. Continue aquatic exercise.  Patient is [place]oc[place]. I search the [employer] and physical medicine and rehabilitation website for a physician in that area. She copy down the information and will also request records to be sent to that physician.  Return to clinic as needed

## 2013-03-07 NOTE — Patient Instructions (Signed)
Request records to be sent to your new physical medicine and rehabilitation MD  You still have 2 refills left on the Lyrica prescription   Refills for the diclofenac gel and the Flexeril sent to pharmacy

## 2013-03-10 ENCOUNTER — Telehealth: Payer: Self-pay

## 2013-03-10 NOTE — Telephone Encounter (Signed)
[facility] would like someone to give them a call back regarding patient's Lyrica 150 mg tablets.  [phone] Reference # [id number]

## 2013-03-11 NOTE — Telephone Encounter (Signed)
Lyrica approved 02/09/2013-03/11/2014.  Patient informed.

## 2013-03-15 ENCOUNTER — Telehealth: Payer: Self-pay

## 2013-03-15 NOTE — Telephone Encounter (Signed)
Voltaren gel prior authorization has been initiated and approved.

## 2013-03-15 NOTE — Telephone Encounter (Signed)
[facility] regarding voltaren gel rx.  Please call and reference #[id number]

## 2013-04-04 ENCOUNTER — Telehealth: Payer: Self-pay

## 2013-04-04 NOTE — Telephone Encounter (Signed)
meloxicam 7.5 mg qd with food for 7days

## 2013-04-04 NOTE — Telephone Encounter (Signed)
Patients insurance will not cover the voltaren gel.  Alternatives include:etodolac, ibuprofen, meloxicam, nabumetone and or naproxen.  Please advise.

## 2013-04-05 NOTE — Telephone Encounter (Signed)
Left message for patient to call office regarding her voltaren gel.  Per Dr Wynn BankerKirsteins patient should try meloxicam 7.5mg  daily, but I wanted to make sure she is not taking meloxicam 15mg  daily.  Her voltaren gel has been denied by insurance.

## 2013-04-05 NOTE — Telephone Encounter (Signed)
Patient is using arnica cream for now.  [place] and will follow up with a provider there.  Therefore meloxicam will not be sent in for her.

## 2013-05-26 ENCOUNTER — Telehealth: Payer: Self-pay

## 2013-05-26 NOTE — Telephone Encounter (Signed)
Patient called requesting medical records be sent to her new provider.  Advised patient she would need to have medical release signed.  Jessica GableArdenia Davis will mail patient a form.

## 2013-11-18 ENCOUNTER — Telehealth: Payer: Self-pay | Admitting: Physical Medicine & Rehabilitation

## 2013-11-22 NOTE — Telephone Encounter (Signed)
completed
